# Patient Record
Sex: Female | Born: 1950 | Race: White | Hispanic: No | Smoking: Never smoker
Health system: Southern US, Community
[De-identification: ages and names within clinical notes are randomized; demographics above are authoritative.]

## PROBLEM LIST (undated history)

## (undated) DIAGNOSIS — E119 Type 2 diabetes mellitus without complications: Secondary | ICD-10-CM

## (undated) DIAGNOSIS — E039 Hypothyroidism, unspecified: Secondary | ICD-10-CM

## (undated) DIAGNOSIS — K219 Gastro-esophageal reflux disease without esophagitis: Secondary | ICD-10-CM

## (undated) DIAGNOSIS — N200 Calculus of kidney: Secondary | ICD-10-CM

## (undated) DIAGNOSIS — E785 Hyperlipidemia, unspecified: Secondary | ICD-10-CM

## (undated) HISTORY — PX: RENAL EXPLORATION: SHX2323

## (undated) HISTORY — PX: KNEE ARTHROSCOPY: SUR90

## (undated) HISTORY — DX: Hypothyroidism, unspecified: E03.9

## (undated) HISTORY — DX: Calculus of kidney: N20.0

## (undated) HISTORY — PX: OTHER SURGICAL HISTORY: SHX169

## (undated) HISTORY — PX: LAPAROSCOPIC GASTRIC BYPASS: SUR771

## (undated) HISTORY — DX: Gastro-esophageal reflux disease without esophagitis: K21.9

## (undated) HISTORY — DX: Hyperlipidemia, unspecified: E78.5

---

## 2020-02-10 ENCOUNTER — Other Ambulatory Visit: Payer: Self-pay | Admitting: Internal Medicine

## 2020-02-10 DIAGNOSIS — Z1231 Encounter for screening mammogram for malignant neoplasm of breast: Secondary | ICD-10-CM

## 2020-02-10 DIAGNOSIS — R200 Anesthesia of skin: Secondary | ICD-10-CM

## 2020-02-16 ENCOUNTER — Ambulatory Visit
Admission: RE | Admit: 2020-02-16 | Discharge: 2020-02-16 | Disposition: A | Discharge status: Home / Self Care | Payer: Medicare Other | Source: Ambulatory Visit | Attending: Internal Medicine | Admitting: Internal Medicine

## 2020-02-16 ENCOUNTER — Other Ambulatory Visit: Payer: Self-pay

## 2020-02-16 DIAGNOSIS — R200 Anesthesia of skin: Secondary | ICD-10-CM | POA: Diagnosis present

## 2020-04-08 ENCOUNTER — Encounter

## 2020-04-09 ENCOUNTER — Ambulatory Visit
Admission: RE | Admit: 2020-04-09 | Discharge: 2020-04-09 | Disposition: A | Discharge status: Home / Self Care | Payer: Medicare Other | Source: Ambulatory Visit | Attending: Internal Medicine | Admitting: Internal Medicine

## 2020-04-09 DIAGNOSIS — Z1231 Encounter for screening mammogram for malignant neoplasm of breast: Secondary | ICD-10-CM | POA: Diagnosis not present

## 2020-05-16 ENCOUNTER — Emergency Department
Admission: EM | Admit: 2020-05-16 | Discharge: 2020-05-16 | Disposition: A | Discharge status: Home / Self Care | Payer: Medicare Other | Attending: Emergency Medicine | Admitting: Emergency Medicine

## 2020-05-16 ENCOUNTER — Encounter: Payer: Self-pay | Admitting: Internal Medicine

## 2020-05-16 ENCOUNTER — Other Ambulatory Visit: Payer: Self-pay

## 2020-05-16 DIAGNOSIS — B86 Scabies: Secondary | ICD-10-CM

## 2020-05-16 DIAGNOSIS — R21 Rash and other nonspecific skin eruption: Secondary | ICD-10-CM | POA: Diagnosis present

## 2020-05-16 DIAGNOSIS — L299 Pruritus, unspecified: Secondary | ICD-10-CM | POA: Insufficient documentation

## 2020-05-16 MED ORDER — PERMETHRIN 5 % EX CREA: 1.0000 "application " | TOPICAL_CREAM | Freq: Once | CUTANEOUS | 0 refills | Status: AC

## 2020-05-16 MED ORDER — PREDNISONE 10 MG PO TABS: ORAL_TABLET | ORAL | 0 refills | Status: AC

## 2020-05-16 MED ORDER — PREDNISONE 20 MG PO TABS
60.0000 mg | ORAL_TABLET | Freq: Once | ORAL | Status: AC
  Administered 2020-05-16: 60 mg via ORAL
  Filled 2020-05-16: qty 3

## 2020-05-16 NOTE — ED Triage Notes (Signed)
Pt here for possible insect bites or hives, bug bites noted in triage, no hives noted, pt c/o itching

## 2020-05-16 NOTE — Discharge Instructions (Addendum)
You were seen today for a rash.  Your rash is most consistent with scabies.  I am giving you a prescription for steroids and permethrin cream.  Please use this as directed.  Please follow-up with your PCP if symptoms persist or worsen.

## 2020-05-16 NOTE — ED Provider Notes (Signed)
[facility] Emergency Department Provider Note ____________________________________________  Time seen: 1250  I have reviewed the triage vital signs and the nursing notes.  HISTORY  Chief Complaint  Urticaria and Insect Bite   HPI Jill Lopez is a 68 y.o. female presents to the ER today with complaint of rash.  She noticed this about 1 month ago.  She reports multiple insect bites with intense itchiness.  She has not noticed any drainage from these areas.  She reports she has cleaned her entire house, wash all of her bedding.  She has not noticed any bedbugs.  She does have a dog but has not noticed any fleas.  She denies changes in soaps, lotions or detergents.  She has tried Benadryl, hydrocortisone cream, alcohol and clear nail polish with minimal relief of symptoms.  She has not come in contact with anything that she is allergic to.  History reviewed. No pertinent past medical history.  There are no problems to display for this patient.   History reviewed. No pertinent surgical history.  Prior to Admission medications   Medication Sig Start Date End Date Taking? Authorizing Provider  permethrin (ELIMITE) 5 % cream Apply 1 application topically once for 1 dose. 05/16/20 05/16/20  Lorre Munroe, NP  predniSONE (DELTASONE) 10 MG tablet Take 6 tabs day 1, 5 tabs day 2, 4 tabs day 3, 3 tabs day 4, 2 tabs day 5, 1 tab day 6 05/16/20   Lorre Munroe, NP    Allergies Patient has no known allergies.  Family History  Problem Relation Age of Onset  . Breast cancer Mother     Social History Social History   Tobacco Use  . Smoking status: Not on file  Substance Use Topics  . Alcohol use: Not on file  . Drug use: Not on file    Review of Systems  Constitutional: Negative for fever, chills or body aches. Cardiovascular: Negative for chest pain or chest tightness. Respiratory: Negative for cough or shortness of breath. Skin: Positive for  rash. Neurological: Negative for headaches, focal weakness, tingling or numbness. ____________________________________________  PHYSICAL EXAM:  VITAL SIGNS: ED Triage Vitals  Enc Vitals Group     BP 05/16/20 1207 137/79     Pulse Rate 05/16/20 1207 60     Resp 05/16/20 1207 16     Temp 05/16/20 1207 98 F (36.7 C)     Temp Source 05/16/20 1207 Oral     SpO2 05/16/20 1207 98 %     Weight 05/16/20 1208 190 lb (86.2 kg)     Height 05/16/20 1208 5\' 7"  (1.702 m)     Head Circumference --      Peak Flow --      Pain Score 05/16/20 1208 0     Pain Loc --      Pain Edu? --      Excl. in GC? --     Constitutional: Alert and oriented. Well appearing and in no distress. Cardiovascular: Normal rate, regular rhythm.  Respiratory: Normal respiratory effort. No wheezes/rales/rhonchi. Neurologic:  Normal gait without ataxia. Normal speech and language. No gross focal neurologic deficits are appreciated. Skin: Grouped papular lesions on erythematous base noted in bilateral groins, bilateral buttocks and underneath bilateral breast.  She has similar scattered lesions noted on her upper chest, arms and lower legs. ____________________________________________  INITIAL IMPRESSION / ASSESSMENT AND PLAN / ED COURSE  Rash:  DDx include bed bug bites, flea bites, chiggers vs scabies Prednisone 60  mg PO x 1 RX for Permethrin cream- use as directed RX for Pred Taper x 6 days ____________________________________________  FINAL CLINICAL IMPRESSION(S) / ED DIAGNOSES  Final diagnoses:  Scabies  Pruritus      Lorre Munroe, NP 05/16/20 1300    Concha Se, MD 05/16/20 1315

## 2021-05-17 ENCOUNTER — Other Ambulatory Visit: Payer: Self-pay | Admitting: Family Medicine

## 2021-05-17 ENCOUNTER — Other Ambulatory Visit (HOSPITAL_COMMUNITY): Payer: Self-pay | Admitting: Family Medicine

## 2021-05-17 DIAGNOSIS — S32040A Wedge compression fracture of fourth lumbar vertebra, initial encounter for closed fracture: Secondary | ICD-10-CM

## 2021-05-17 DIAGNOSIS — Z1231 Encounter for screening mammogram for malignant neoplasm of breast: Secondary | ICD-10-CM

## 2021-05-21 ENCOUNTER — Ambulatory Visit
Admission: RE | Admit: 2021-05-21 | Discharge: 2021-05-21 | Disposition: A | Discharge status: Home / Self Care | Payer: Medicare Other | Source: Ambulatory Visit | Attending: Family Medicine | Admitting: Family Medicine

## 2021-05-21 DIAGNOSIS — S32040A Wedge compression fracture of fourth lumbar vertebra, initial encounter for closed fracture: Secondary | ICD-10-CM | POA: Diagnosis present

## 2022-05-03 IMAGING — MR MR LUMBAR SPINE W/O CM
5 series · 31 of 48 positions shown · non-contrast
Comparison: None.

CLINICAL DATA: Fall 1 week ago with low back pain and tailbone pain

EXAM:
MRI LUMBAR SPINE WITHOUT CONTRAST
TECHNIQUE: Multiplanar, multisequence MR imaging of the lumbar spine was
performed. No intravenous contrast was administered.

[Series 5: T2 · sagittal · 4.0mm · 0.81mm/px · 6 of 17 slices shown (1 of 2)]
[im 1/17]
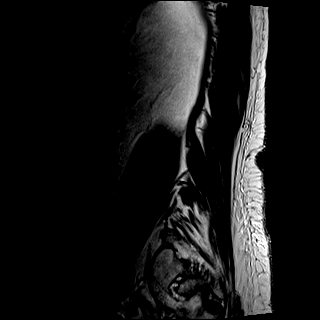
[im 4/17]
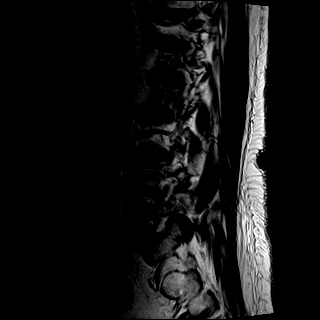
[im 7/17]
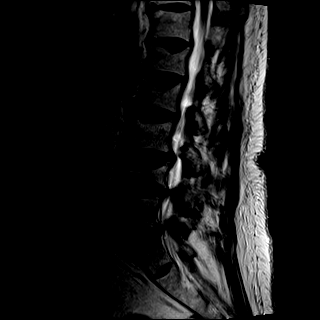
[im 10/17]
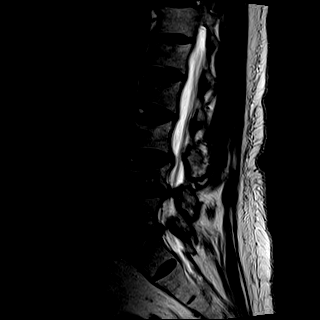
[im 13/17]
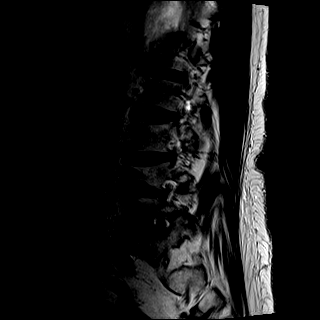
[im 17/17]
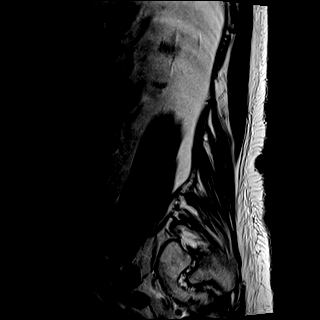

[Series 6: T1 · sagittal · 4.0mm · 0.81mm/px · 6 of 17 slices shown (1 of 2)]
[im 1/17]
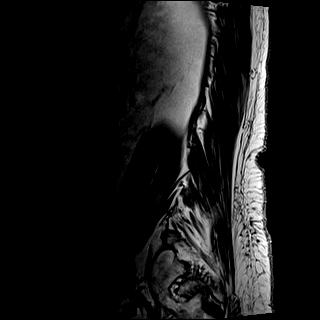
[im 4/17]
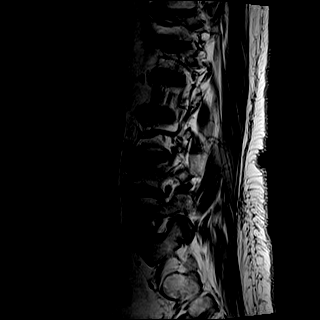
[im 7/17]
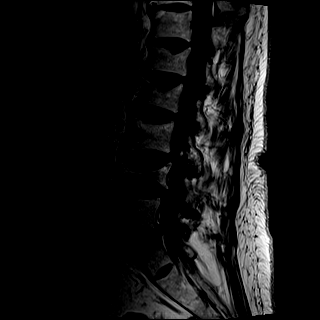
[im 10/17]
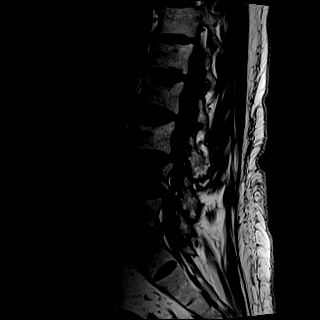
[im 13/17]
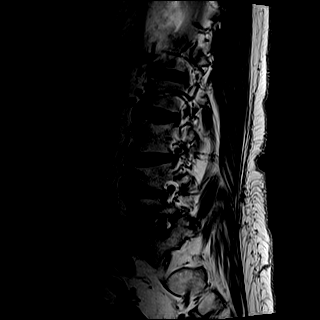
[im 17/17]
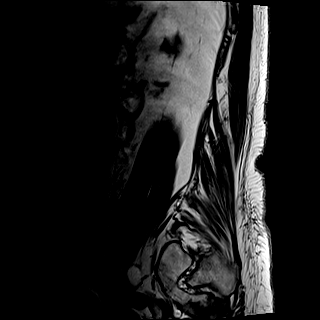

[Series 7: STIR · sagittal · 4.0mm · 0.41mm/px · 1 of 17 slices shown]
[im 1/17]
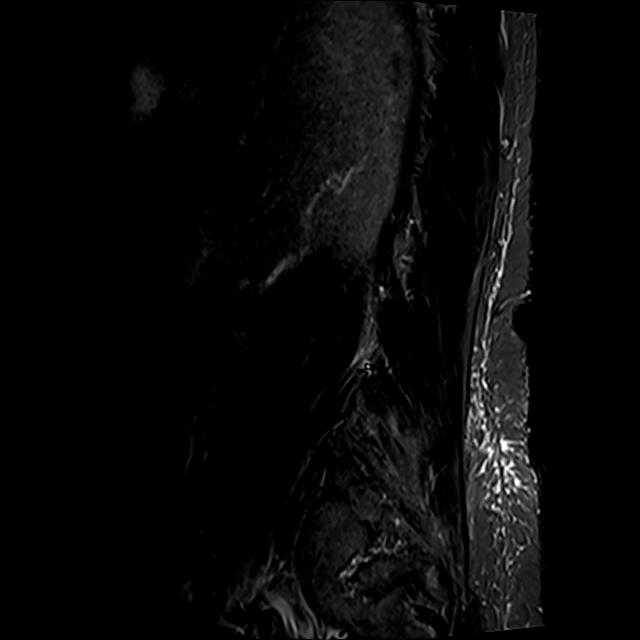

[Series 8: T2 · axial · 4.0mm · 0.78mm/px · z∈[-42,+152]mm · 9 of 40 slices shown (2 of 2)]
[im 1/40]
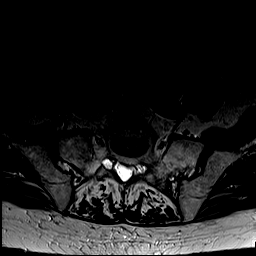
[im 6/40]
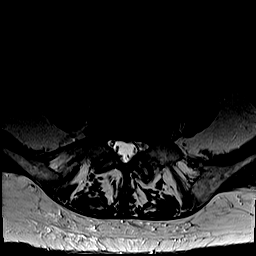
[im 12/40]
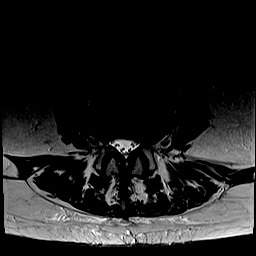
[im 17/40]
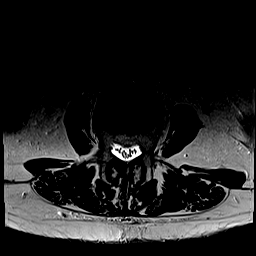
[im 20/40]
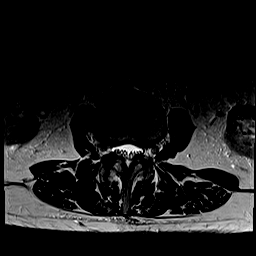
[im 23/40]
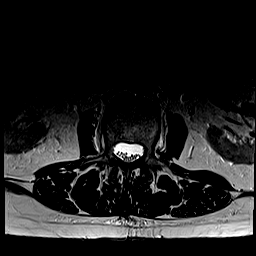
[im 28/40]
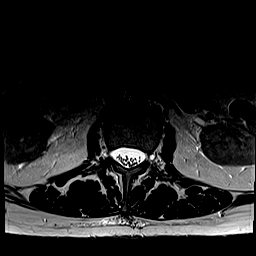
[im 34/40]
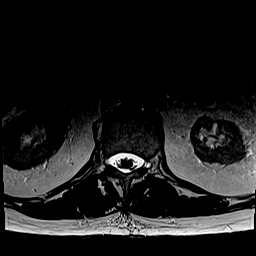
[im 40/40]
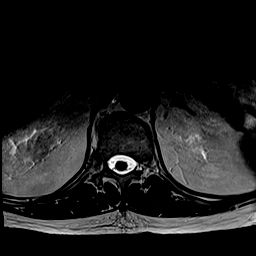

[Series 9: T1 · axial · 4.0mm · 0.39mm/px · z∈[-42,+152]mm · 9 of 40 slices shown (2 of 2)]
[im 1/40]
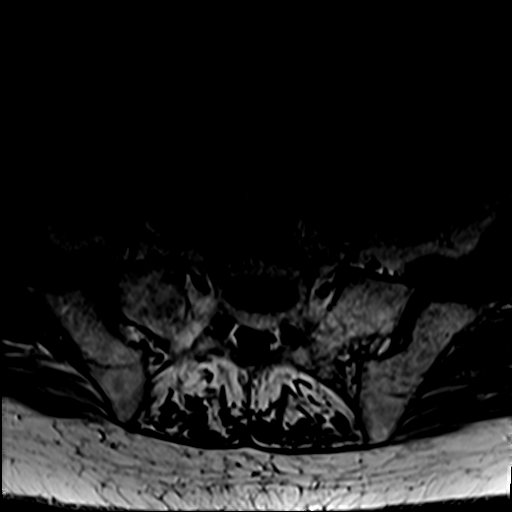
[im 6/40]
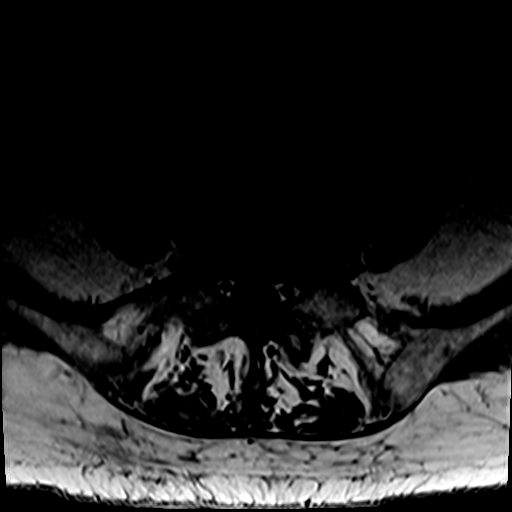
[im 12/40]
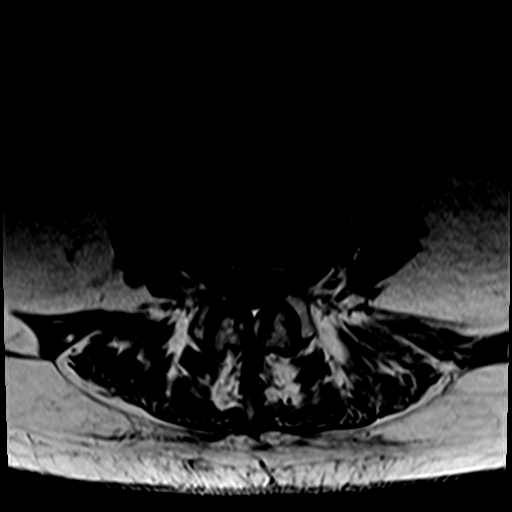
[im 17/40]
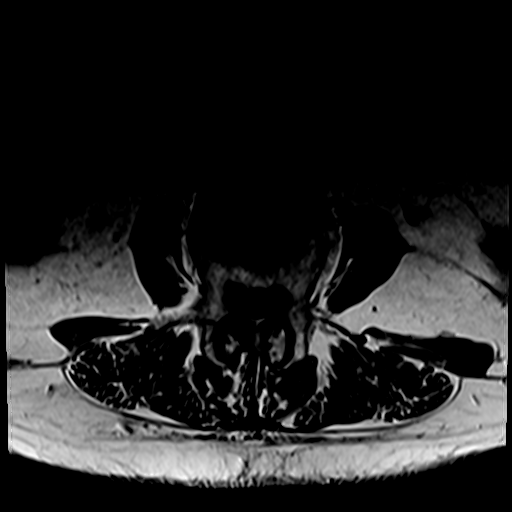
[im 20/40]
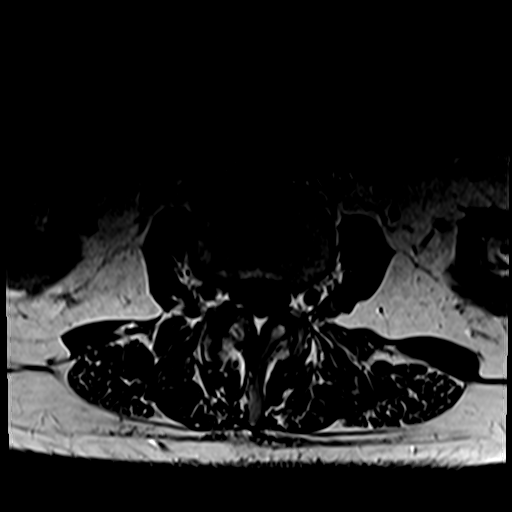
[im 23/40]
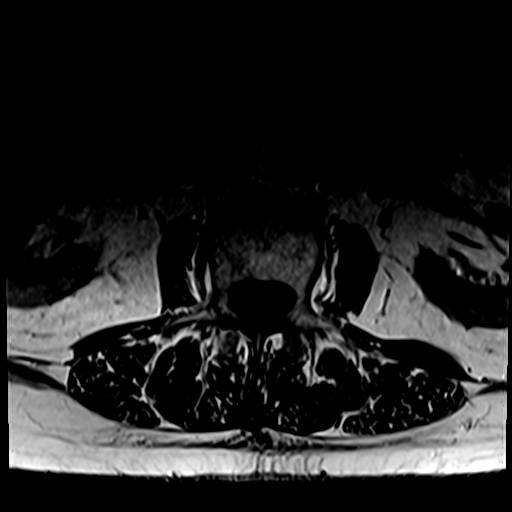
[im 28/40]
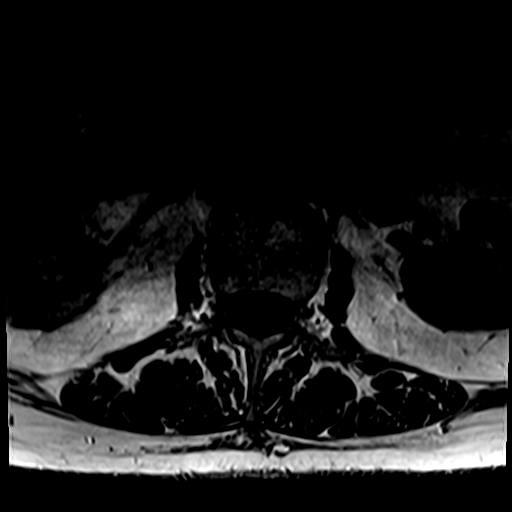
[im 34/40]
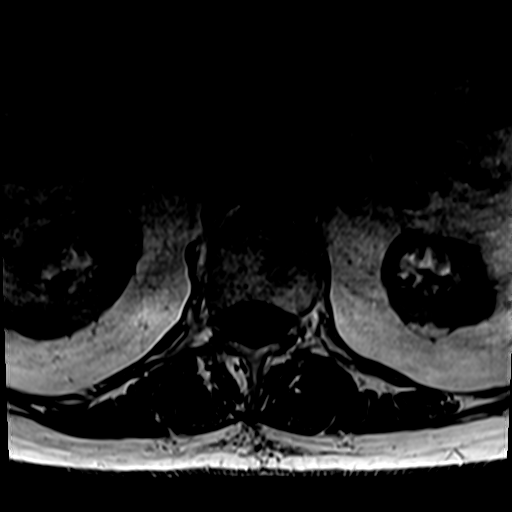
[im 40/40]
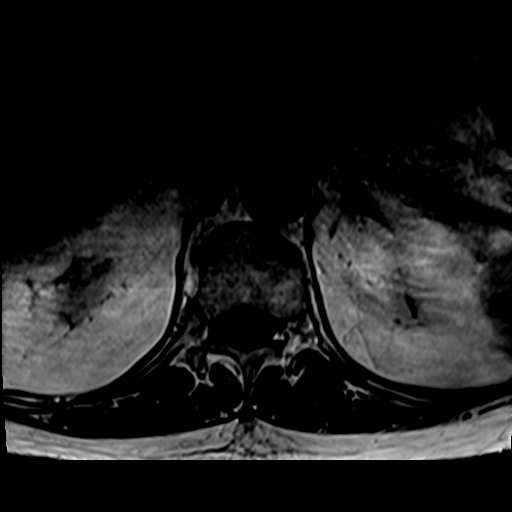

[31 of 48 positions shown; findings below may reference images not displayed]

FINDINGS: Segmentation:  Standard.

Alignment: Mild retrolisthesis at L4-5 and anterolisthesis at L3-4,
accentuated by posttraumatic deformity of the L4 vertebral body

Vertebrae: No acute fracture, evidence of discitis, or bone lesion.
Remote compression fractures of L1, L2, L3, and L4. At L4 there is
advanced central height loss and retropulsion. Mild edematous marrow
signal at the level of L4, but likely reactive to a Schmorl's node.
The fracture appears chronic and healed.

Conus medullaris and cauda equina: Conus extends to the L2 level.
Conus and cauda equina appear normal.

Paraspinal and other soft tissues: No perispinal mass or edema seen.
Scarring at the upper pole left kidney.

Disc levels:

T12- L1: Unremarkable.

L1-L2: Unremarkable.

L2-L3: Mild facet spurring and disc bulging

L3-L4: Disc bulging and mild facet spurring. Moderate narrowing of
the thecal sac without static compression

L4-L5: Disc bulging and mild facet spurring with ligamentum flavum
thickening. Accentuation by chronic L4 retropulsion. Moderate spinal
stenosis. There is potential L5 impingement at both subarticular
recesses

L5-S1:Disc narrowing and bulging eccentric to the left. Facet
osteoarthritis with bulky left-sided spurring. Left foraminal
impingement. Bilateral Subarticular recess narrowing that could
affect the S1 nerve roots.
IMPRESSION: 1. No acute finding such as fracture. Remote compression fractures
of L1-L4 with advanced height loss at L4.
2. Lumbar spine degeneration which is generalized and exacerbated at
L4 due to prior retropulsion.
3. L3-4 and L4-5 moderate spinal stenosis.
4. L4-5 and L5-S1 bilateral subarticular recess stenosis that could
affect the L5 and S1 nerve roots.
5. L5-S1 left foraminal impingement.

## 2022-05-17 ENCOUNTER — Other Ambulatory Visit: Payer: Self-pay | Admitting: Internal Medicine

## 2022-05-17 DIAGNOSIS — R413 Other amnesia: Secondary | ICD-10-CM

## 2022-06-12 ENCOUNTER — Ambulatory Visit (HOSPITAL_COMMUNITY)
Admission: RE | Admit: 2022-06-12 | Discharge: 2022-06-12 | Disposition: A | Discharge status: Home / Self Care | Payer: Medicare Other | Source: Ambulatory Visit | Attending: Internal Medicine | Admitting: Internal Medicine

## 2022-06-12 DIAGNOSIS — R413 Other amnesia: Secondary | ICD-10-CM | POA: Diagnosis not present

## 2022-06-13 ENCOUNTER — Other Ambulatory Visit: Payer: Self-pay | Admitting: Internal Medicine

## 2022-06-13 DIAGNOSIS — Z1231 Encounter for screening mammogram for malignant neoplasm of breast: Secondary | ICD-10-CM

## 2022-07-10 ENCOUNTER — Ambulatory Visit
Admission: RE | Admit: 2022-07-10 | Discharge: 2022-07-10 | Disposition: A | Discharge status: Home / Self Care | Payer: Medicare Other | Source: Ambulatory Visit | Attending: Internal Medicine | Admitting: Internal Medicine

## 2022-07-10 DIAGNOSIS — Z1231 Encounter for screening mammogram for malignant neoplasm of breast: Secondary | ICD-10-CM | POA: Diagnosis present

## 2022-09-16 ENCOUNTER — Emergency Department
Admission: EM | Admit: 2022-09-16 | Discharge: 2022-09-16 | Disposition: A | Discharge status: Home / Self Care | Payer: No Typology Code available for payment source | Attending: Emergency Medicine | Admitting: Emergency Medicine

## 2022-09-16 ENCOUNTER — Other Ambulatory Visit: Payer: Self-pay

## 2022-09-16 ENCOUNTER — Emergency Department: Payer: No Typology Code available for payment source

## 2022-09-16 DIAGNOSIS — Y92410 Unspecified street and highway as the place of occurrence of the external cause: Secondary | ICD-10-CM | POA: Diagnosis not present

## 2022-09-16 DIAGNOSIS — S62512A Displaced fracture of proximal phalanx of left thumb, initial encounter for closed fracture: Secondary | ICD-10-CM | POA: Insufficient documentation

## 2022-09-16 DIAGNOSIS — S6992XA Unspecified injury of left wrist, hand and finger(s), initial encounter: Secondary | ICD-10-CM | POA: Diagnosis present

## 2022-09-16 MED ORDER — HYDROCODONE-ACETAMINOPHEN 5-325 MG PO TABS: 1.0000 | ORAL_TABLET | Freq: Four times a day (QID) | ORAL | 0 refills | Status: DC | PRN

## 2022-09-16 MED ORDER — HYDROCODONE-ACETAMINOPHEN 5-325 MG PO TABS
1.0000 | ORAL_TABLET | ORAL | Status: AC
  Administered 2022-09-16: 1 via ORAL
  Filled 2022-09-16: qty 1
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
## 2022-09-16 NOTE — Discharge Instructions (Addendum)
Please keep splint clean and dry at all times.  You may use sling as needed for comfort.  Take Tylenol for mild to moderate pain and for severe pain use Norco.  Call orthopedic office Monday morning to schedule follow-up appointment.  Return to the ER for any worsening symptoms or any urgent changes in health
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
## 2023-01-26 ENCOUNTER — Ambulatory Visit: Payer: Medicare Other | Admitting: Urology

## 2023-02-08 ENCOUNTER — Ambulatory Visit: Payer: Medicare Other | Admitting: Urology

## 2023-02-19 ENCOUNTER — Encounter: Payer: Self-pay | Admitting: Emergency Medicine

## 2023-02-19 ENCOUNTER — Emergency Department: Attending: Cardiovascular Disease

## 2023-02-19 ENCOUNTER — Other Ambulatory Visit: Payer: Self-pay

## 2023-02-19 ENCOUNTER — Emergency Department
Admission: EM | Admit: 2023-02-19 | Discharge: 2023-02-19 | Disposition: A | Discharge status: Home / Self Care | Payer: Medicare Other | Attending: Emergency Medicine | Admitting: Emergency Medicine

## 2023-02-19 ENCOUNTER — Other Ambulatory Visit: Payer: Self-pay | Admitting: *Deleted

## 2023-02-19 DIAGNOSIS — R001 Bradycardia, unspecified: Secondary | ICD-10-CM

## 2023-02-19 DIAGNOSIS — E119 Type 2 diabetes mellitus without complications: Secondary | ICD-10-CM | POA: Insufficient documentation

## 2023-02-19 HISTORY — DX: Type 2 diabetes mellitus without complications: E11.9

## 2023-02-19 LAB — COMPREHENSIVE METABOLIC PANEL
ALT: 13 U/L (ref 0–44)
AST: 19 U/L (ref 15–41)
Albumin: 3.6 g/dL (ref 3.5–5.0)
Alkaline Phosphatase: 55 U/L (ref 38–126)
Anion gap: 9 (ref 5–15)
BUN: 11 mg/dL (ref 8–23)
CO2: 22 mmol/L (ref 22–32)
Calcium: 8.9 mg/dL (ref 8.9–10.3)
Chloride: 107 mmol/L (ref 98–111)
Creatinine, Ser: 0.68 mg/dL (ref 0.44–1.00)
GFR, Estimated: >60 mL/min (ref >60)
Glucose, Bld: 129 mg/dL — ABNORMAL HIGH (ref 70–99)
Potassium: 4.1 mmol/L (ref 3.5–5.1)
Sodium: 138 mmol/L (ref 135–145)
Total Bilirubin: 0.8 mg/dL (ref 0.3–1.2)
Total Protein: 7.1 g/dL (ref 6.5–8.1)

## 2023-02-19 LAB — CBC
HCT: 47.6 % — ABNORMAL HIGH (ref 36.0–46.0)
Hemoglobin: 15 g/dL (ref 12.0–15.0)
MCH: 30.5 pg (ref 26.0–34.0)
MCHC: 31.5 g/dL (ref 30.0–36.0)
MCV: 96.7 fL (ref 80.0–100.0)
Platelets: 209 10*3/uL (ref 150–400)
RBC: 4.92 MIL/uL (ref 3.87–5.11)
RDW: 13.1 % (ref 11.5–15.5)
WBC: 4.8 10*3/uL (ref 4.0–10.5)
nRBC: 0 % (ref 0.0–0.2)

## 2023-02-19 LAB — MAGNESIUM: Magnesium: 2.1 mg/dL (ref 1.7–2.4)

## 2023-02-19 LAB — TROPONIN I (HIGH SENSITIVITY): Troponin I (High Sensitivity): 6 ng/L (ref <18)

## 2023-02-19 NOTE — ED Provider Notes (Signed)
[facility] Provider Note    Event Date/Time   First MD Initiated Contact with Patient 02/19/23 [phone]     (approximate)   History   Low Heart Rate   HPI  Jill Lopez is a 72 y.o. female past medical history significant for cervical radiculopathy, diabetes, chronic pain, presents to the emergency department with low heart rate.  Patient states that she went to a follow-up appointment today at [facility] clinic and was sent to the emergency department for low heart rate.  According to nursing staff stated that patient had a heart rate in the 30s that was prolonged, was having some difficulty with gait and some lightheadedness during that time, walked over from [facility].  No complaints at this time.  Endorses weight gain over the past couple months.  Denies any new medications.  No recent falls or head trauma.  Denies any chest pain or shortness of breath.  No nausea, vomiting or diarrhea.     Physical Exam   Triage Vital Signs: ED Triage Vitals  Enc Vitals Group     BP 02/19/23 0920 (!) 142/81     Pulse Rate 02/19/23 0920 (!) 54     Resp 02/19/23 0920 18     Temp 02/19/23 0920 97.6 F (36.4 C)     Temp Source 02/19/23 0920 Oral     SpO2 02/19/23 0920 100 %     Weight --      Height --      Head Circumference --      Peak Flow --      Pain Score 02/19/23 0918 0     Pain Loc --      Pain Edu? --      Excl. in GC? --     Most recent vital signs: Vitals:   02/19/23 0920 02/19/23 1030  BP: (!) 142/81 129/75  Pulse: (!) 54 (!) 52  Resp: 18 15  Temp: 97.6 F (36.4 C)   SpO2: 100% 97%    Physical Exam Constitutional:      Appearance: She is well-developed.  HENT:     Head: Atraumatic.  Eyes:     Conjunctiva/sclera: Conjunctivae normal.  Cardiovascular:     Rate and Rhythm: Regular rhythm. Bradycardia present.     Comments: +2 radial and DP/PT pulses that are equal bilaterally Pulmonary:     Effort: No respiratory distress.   Abdominal:     General: There is no distension.  Musculoskeletal:        General: Normal range of motion.     Cervical back: Normal range of motion.     Right lower leg: No edema.     Left lower leg: No edema.  Skin:    General: Skin is warm.     Capillary Refill: Capillary refill takes less than 2 seconds.  Neurological:     Mental Status: She is alert. Mental status is at baseline.     IMPRESSION / MDM / ASSESSMENT AND PLAN / ED COURSE  I reviewed the triage vital signs and the nursing notes.  Differential diagnosis including symptomatic bradycardia, heart block, electrolyte abnormality, hypothyroidism, ACS, dysrhythmia  EKG  I, Corena Herter, the attending physician, personally viewed and interpreted this ECG.   Rate: 54  Rhythm: Normal sinus  Axis: Normal  Intervals: QTc prolonged at 572.  ST&T Change: Flattening of T waves to the inferior and lateral leads.  No significant ST elevation. No prior EKG to compare  Sinus  bradycardia with frequent PVCs while on cardiac telemetry.    LABS (all labs ordered are listed, but only abnormal results are displayed) Labs interpreted as -    Labs Reviewed  CBC - Abnormal; Notable for the following components:      Result Value   HCT 47.6 (*)    All other components within normal limits  COMPREHENSIVE METABOLIC PANEL - Abnormal; Notable for the following components:   Glucose, Bld 129 (*)    All other components within normal limits  MAGNESIUM  TSH  TROPONIN I (HIGH SENSITIVITY)  TROPONIN I (HIGH SENSITIVITY)     MDM    No significant electrolyte abnormalities.  Thyroid studies within normal limits.  Troponin negative have low suspicion for ACS.  Discussed the patient's case with cardiology Dr. Malaga Callas, most likely had bradycardia with frequent PVCs given that she has had PVCs while on cardiac telemetry in the emergency department.  Recommended outpatient follow-up with cardiology with likely cardiac telemetry  monitoring.  Given return precautions.   PROCEDURES:  Critical Care performed: No  Procedures  Patient's presentation is most consistent with acute presentation with potential threat to life or bodily function.   MEDICATIONS ORDERED IN ED: Medications - No data to display  FINAL CLINICAL IMPRESSION(S) / ED DIAGNOSES   Final diagnoses:  Bradycardia     Rx / DC Orders   ED Discharge Orders          Ordered    Ambulatory referral to Cardiology       Comments: If you have not heard from the Cardiology office within the next 72 hours please call [phone].   02/19/23 1236             Note:  This document was prepared using Dragon voice recognition software and may include unintentional dictation errors.   Corena Herter, MD 02/19/23 [phone]

## 2023-02-19 NOTE — ED Triage Notes (Signed)
Patient sent to ED from [facility] for low heart rate- 30's. Initially being seen for orthopedic issues when vital noted to be abnormal. Patient denies cardiac history or pain at this time.

## 2023-02-19 NOTE — Discharge Instructions (Addendum)
You are seen in the emergency department for low heart rate.  Your heart rate was in the 50s in the emergency department.  I discussed with cardiology who is going to follow you up in outpatient.  Concern that you are having frequent PVCs in office which was causing your heart rate to read abnormally low.  Return to the emergency department if you have any episodes of feeling lightheaded, passing out or weak.  Stay hydrated and drink plenty of fluids.

## 2023-02-20 ENCOUNTER — Other Ambulatory Visit: Payer: Self-pay | Admitting: Family Medicine

## 2023-02-20 DIAGNOSIS — M5412 Radiculopathy, cervical region: Secondary | ICD-10-CM

## 2023-02-22 DIAGNOSIS — R001 Bradycardia, unspecified: Secondary | ICD-10-CM

## 2023-03-03 NOTE — Progress Notes (Signed)
H&P  Chief Complaint: Frequent urination, incontinence  History of Present Illness: Jill Lopez is a 72 y.o. year old female referred by Dr. Nemiah Commander for evaluation and management of urinary frequency, urgency and urgency incontinence.  This has been a issue for several months.  She has a good stream and usually feels like she empties well.  However, because of the amount of leakage she has she usually goes through at least 1-2 pads a day.  She denies gross hematuria, dysuria.  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  She also has a history of urolithiasis, last treated about 10 years ago in [place].  He does drink a fair amount of tea and also 1 cup of coffee a day.  No sodas.  Past Medical History:  Diagnosis Date   Diabetes mellitus without complication (HCC)     No past surgical history on file.  Home Medications:  (Not in a hospital admission)   Allergies: No Known Allergies  Family History  Problem Relation Age of Onset   Breast cancer Mother     Social History:  has no history on file for tobacco use, alcohol use, and drug use.  ROS: A complete review of systems was performed.  All systems are negative except for pertinent findings as noted.  Physical Exam:  Vital signs in last 24 hours: @VSRANGES @ General:  Alert and oriented, No acute distress HEENT: Normocephalic, atraumatic Neck: No JVD or lymphadenopathy Cardiovascular: Regular rate  Lungs: Normal inspiratory/expiratory excursion Neurologic: Grossly intact  I have reviewed prior pt notes  I have reviewed notes from referring/previous physicians  I have reviewed urinalysis results  I have independently reviewed prior imaging-bladder scan  I have reviewed prior laboratories-CHEM profile and CBC from 2 weeks ago.  Normal renal function.  Impression/Assessment:  Overactive bladder-wet.  Empties well.  History of urolithiasis  History  of duplication of upper tracts  Plan:  1.  Overactive bladder guide sheet given  2.  Limit caffeine in diet  3.  She takes her furosemide at about 7 PM-I recommended she take it either early in the morning or earlier in the afternoon  4.  Consider eventual KUB to follow-up history of urolithiasis  5.  I will see back in about 3 months to recheck symptoms  Chelsea Aus 03/03/2023, 6:35 PM  Bertram Millard. Lashawn Bromwell MD

## 2023-03-06 ENCOUNTER — Ambulatory Visit (INDEPENDENT_AMBULATORY_CARE_PROVIDER_SITE_OTHER): Payer: Medicare Other | Admitting: Urology

## 2023-03-06 ENCOUNTER — Encounter: Payer: Self-pay | Admitting: Urology

## 2023-03-06 VITALS — BP 91/61 | HR 85 | Ht 66.0 in | Wt 210.0 lb

## 2023-03-06 DIAGNOSIS — R350 Frequency of micturition: Secondary | ICD-10-CM | POA: Diagnosis not present

## 2023-03-06 DIAGNOSIS — N3281 Overactive bladder: Secondary | ICD-10-CM | POA: Diagnosis not present

## 2023-03-06 DIAGNOSIS — Z87442 Personal history of urinary calculi: Secondary | ICD-10-CM

## 2023-03-06 DIAGNOSIS — M5412 Radiculopathy, cervical region: Secondary | ICD-10-CM | POA: Diagnosis not present

## 2023-03-06 DIAGNOSIS — N200 Calculus of kidney: Secondary | ICD-10-CM

## 2023-03-06 LAB — BLADDER SCAN AMB NON-IMAGING: Scan Result: 0

## 2023-03-06 NOTE — Progress Notes (Signed)
post void residual =0mL 

## 2023-03-16 ENCOUNTER — Telehealth: Payer: Self-pay | Admitting: *Deleted

## 2023-03-16 NOTE — Telephone Encounter (Signed)
Call placed to the patient to follow up on the cardiac monitor that was ordered for her from her ED visit. She stated that she has now placed it on and is due to mail it back 5/23.  The patient stated that she has been feeling good without any problems. She has confirmed her appointment on 04/06/23.

## 2023-04-06 ENCOUNTER — Ambulatory Visit: Admitting: Cardiology

## 2023-04-13 ENCOUNTER — Other Ambulatory Visit: Payer: Self-pay | Admitting: Emergency Medicine

## 2023-04-13 ENCOUNTER — Other Ambulatory Visit: Payer: Self-pay

## 2023-04-13 ENCOUNTER — Telehealth: Payer: Self-pay | Admitting: Cardiology

## 2023-04-13 DIAGNOSIS — I480 Paroxysmal atrial fibrillation: Secondary | ICD-10-CM

## 2023-04-13 NOTE — Telephone Encounter (Signed)
Dr. Nemiah Commander called in asking to speak to Dr. Azucena Cecil about this pt. Informed her I wasn't sure if he can consult on her since she is not a pt yet but she stated one of our providers saw her in the ED and ordered the heart monitor. Please advise.    Best c/b - [phone]

## 2023-04-17 NOTE — Telephone Encounter (Signed)
LMOV  

## 2023-04-23 NOTE — Telephone Encounter (Signed)
LMOV to schedule  

## 2023-05-02 ENCOUNTER — Ambulatory Visit: Payer: Medicare Other | Attending: Cardiology

## 2023-05-02 DIAGNOSIS — I480 Paroxysmal atrial fibrillation: Secondary | ICD-10-CM

## 2023-05-03 LAB — ECHOCARDIOGRAM COMPLETE
AR max vel: 3.85 cm2
AV Area VTI: 4.2 cm2
AV Area mean vel: 3.81 cm2
AV Mean grad: 2 mmHg
AV Peak grad: 3.5 mmHg
Ao pk vel: 0.93 m/s
Area-P 1/2: 2.64 cm2
Calc EF: 51.6 %
S' Lateral: 3.9 cm
Single Plane A2C EF: 49.6 %
Single Plane A4C EF: 49.2 %

## 2023-05-04 ENCOUNTER — Ambulatory Visit: Payer: Medicare Other | Attending: Cardiology | Admitting: Cardiology

## 2023-05-04 ENCOUNTER — Encounter: Payer: Self-pay | Admitting: Cardiology

## 2023-05-04 VITALS — BP 98/60 | HR 69 | Ht 67.0 in | Wt 208.8 lb

## 2023-05-04 DIAGNOSIS — I340 Nonrheumatic mitral (valve) insufficiency: Secondary | ICD-10-CM | POA: Insufficient documentation

## 2023-05-04 DIAGNOSIS — E7800 Pure hypercholesterolemia, unspecified: Secondary | ICD-10-CM | POA: Insufficient documentation

## 2023-05-04 DIAGNOSIS — I77810 Thoracic aortic ectasia: Secondary | ICD-10-CM | POA: Diagnosis not present

## 2023-05-04 DIAGNOSIS — I471 Supraventricular tachycardia, unspecified: Secondary | ICD-10-CM | POA: Insufficient documentation

## 2023-05-04 NOTE — Patient Instructions (Addendum)
Medication Instructions:  Your physician recommends that you continue on your current medications as directed. Please refer to the Current Medication list given to you today.  *If you need a refill on your cardiac medications before your next appointment, please call your pharmacy*  Lab Work: -None ordered  Testing/Procedures: Your physician has requested that you have an echocardiogram. Echocardiography is a painless test that uses sound waves to create images of your heart. It provides your doctor with information about the size and shape of your heart and how well your heart's chambers and valves are working. This procedure takes approximately one hour. There are no restrictions for this procedure. Please do NOT wear cologne, perfume, aftershave, or lotions (deodorant is allowed). Please arrive 15 minutes prior to your appointment time.   Follow-Up: At [facility], you and your health needs are our priority.  As part of our continuing mission to provide you with exceptional heart care, we have created designated Provider Care Teams.  These Care Teams include your primary Cardiologist (physician) and Advanced Practice Providers (APPs -  Physician Assistants and Nurse Practitioners) who all work together to provide you with the care you need, when you need it.  Your next appointment:   1 year(s)  Provider:   You may see Debbe Odea, MD or one of the following Advanced Practice Providers on your designated Care Team:   Nicolasa Ducking, NP Eula Listen, PA-C Cadence Fransico Michael, PA-C Charlsie Quest, NP    Other Instructions -Echo to be scheduled in 1 year

## 2023-05-04 NOTE — Progress Notes (Signed)
Cardiology Office Note:    Date:  05/04/2023   ID:  Jill Lopez, DOB 04/23/1951, MRN [id number]  PCP:  Enid Baas, MD   [facility] Providers Cardiologist:  Debbe Odea, MD     Referring MD: Enid Baas, MD   Chief Complaint  Patient presents with   New Patient (Initial Visit)    Patient here for cardiac evaluation following recent ED visit for bradycardia.  Recent echo and Zio results available in chart.  Still has dyspnea on exertion.      History of Present Illness:    Jill Lopez is a 72 y.o. female with a hx of diabetes, hyperlipidemia OSA who presents due to low heart rates.  Saw PCP for scheduled visit about 2 months ago, heart rates were noted to be low in the 30s.  Patient was advised to go to the emergency room.  In the ED, EKG showed sinus bradycardia with heart rate 54.  Cardiac monitor was placed to evaluate any high degree AV block.  Monitor 4/24 did not show occasional paroxysmal SVTs, occasional PVCs 3-5% burden.  Average heart rate 69-72.  Echocardiogram 02/2023 EF 55 to 60%, mild mitral regurgitation, mild aortic root dilatation 42 mm.  She denies any symptoms of palpitations.  Started on semaglutide for both diabetes and weight loss.  Has lost about 10 pounds since starting.  Also changed her eating habits.  Diagnosed with sleep apnea, waiting on CPAP machine.  Past Medical History:  Diagnosis Date   Diabetes mellitus without complication (HCC)    GERD (gastroesophageal reflux disease)    Hyperlipidemia    Hypothyroidism    Kidney stones     Past Surgical History:  Procedure Laterality Date   INJECTION THERAPEUTIC AGENT CARPAL TUNNEL      KNEE ARTHROSCOPY     LAPAROSCOPIC GASTRIC BYPASS     RENAL EXPLORATION      Current Medications: Current Meds  Medication Sig   alendronate (FOSAMAX) 70 MG tablet Take 70 mg by mouth.   cyanocobalamin (VITAMIN B12) 1000 MCG/ML injection Inject 1,000 mcg into the  muscle every 30 (thirty) days.   furosemide (LASIX) 20 MG tablet Take 1 tablet by mouth every morning.   gabapentin (NEURONTIN) 100 MG capsule Take 100 mg by mouth at bedtime.   HYDROcodone-acetaminophen (NORCO) 5-325 MG tablet Take 1 tablet by mouth every 6 (six) hours as needed for moderate pain.   levothyroxine (SYNTHROID) 50 MCG tablet Take 50 mcg by mouth.   lisinopril (ZESTRIL) 2.5 MG tablet Take 2.5 mg by mouth daily.   metFORMIN (GLUCOPHAGE) 500 MG tablet Take 1 tablet by mouth 2 (two) times daily.   ondansetron (ZOFRAN-ODT) 8 MG disintegrating tablet Take 8 mg by mouth every 8 (eight) hours as needed.   pantoprazole (PROTONIX) 40 MG tablet Take 40 mg by mouth.   rosuvastatin (CRESTOR) 20 MG tablet Take 20 mg by mouth.   Semaglutide,0.25 or 0.5MG /DOS, 2 MG/3ML SOPN Inject 0.25 mg into the skin.   traMADol (ULTRAM) 50 MG tablet Take 50 mg by mouth every 6 (six) hours as needed.     Allergies:   Other   Social History   Socioeconomic History   Marital status: Widowed    Spouse name: Not on file   Number of children: Not on file   Years of education: Not on file   Highest education level: Not on file  Occupational History   Not on file  Tobacco Use   Smoking status: Never  Smokeless tobacco: Never  Vaping Use   Vaping Use: Never used  Substance and Sexual Activity   Alcohol use: Never   Drug use: Never   Sexual activity: Not on file  Other Topics Concern   Not on file  Social History Narrative   Not on file   Social Determinants of Health   Financial Resource Strain: Not on file  Food Insecurity: Not on file  Transportation Needs: Not on file  Physical Activity: Not on file  Stress: Not on file  Social Connections: Not on file     Family History: The patient's family history includes Atrial fibrillation in her daughter; Breast cancer in her mother; Heart disease in her daughter.  ROS:   Please see the history of present illness.     All other systems  reviewed and are negative.  EKGs/Labs/Other Studies Reviewed:    The following studies were reviewed today:  EKG Interpretation Date/Time:  Friday May 04 2023 13:58:20 EDT Ventricular Rate:  69 PR Interval:  140 QRS Duration:  74 QT Interval:  382 QTC Calculation: 409 R Axis:   -26  Text Interpretation: Normal sinus rhythm Nonspecific T wave abnormality Confirmed by Debbe Odea ([id number]) on 05/04/2023 2:04:15 PM    Recent Labs: 02/19/2023: ALT 13; BUN 11; Creatinine, Ser 0.68; Hemoglobin 15.0; Magnesium 2.1; Platelets 209; Potassium 4.1; Sodium 138  Recent Lipid Panel No results found for: "CHOL", "TRIG", "HDL", "CHOLHDL", "VLDL", "LDLCALC", "LDLDIRECT"   Risk Assessment/Calculations:             Physical Exam:    VS:  BP 98/60 (BP Location: Left Arm, Patient Position: Sitting, Cuff Size: Large)   Pulse 69   Ht 5\' 7"  (1.702 m)   Wt 208 lb 12.8 oz (94.7 kg)   BMI 32.70 kg/m     Wt Readings from Last 3 Encounters:  05/04/23 208 lb 12.8 oz (94.7 kg)  03/06/23 210 lb (95.3 kg)  09/16/22 189 lb 9.5 oz (86 kg)     GEN:  Well nourished, well developed in no acute distress HEENT: Normal NECK: No JVD; No carotid bruits CARDIAC: RRR, no murmurs, rubs, gallops RESPIRATORY:  Clear to auscultation without rales, wheezing or rhonchi  ABDOMEN: Soft, non-tender, non-distended MUSCULOSKELETAL:  No edema; No deformity  SKIN: Warm and dry NEUROLOGIC:  Alert and oriented x 3 PSYCHIATRIC:  Normal affect   ASSESSMENT:    1. Paroxysmal SVT (supraventricular tachycardia)   2. Pure hypercholesterolemia   3. Aortic root dilatation (HCC)   4. Mitral valve insufficiency, unspecified etiology    PLAN:    In order of problems listed above:  Paroxysmal SVT, PVCs 1.7% burden . echo with normal EF 55 to 60%.  Average heart rate on cardiac monitor 68 to 72 bpm.  BP today 96/60, heart rate 69.  Patient asymptomatic, avoid AV nodal agents.  Untreated sleep apnea possibly contributing  to arrhythmias.  Weight loss and treatment of sleep apnea showed improve Hyperlipidemia, continue Crestor 20 mg daily. Mild aortic root dilatation, continue to monitor serially.  Repeat echo in 1 year.     Follow-up in 1 year.  Medication Adjustments/Labs and Tests Ordered: Current medicines are reviewed at length with the patient today.  Concerns regarding medicines are outlined above.  Orders Placed This Encounter  Procedures   EKG 12-Lead   ECHOCARDIOGRAM COMPLETE   No orders of the defined types were placed in this encounter.   Patient Instructions  Medication Instructions:  Your physician recommends that you  continue on your current medications as directed. Please refer to the Current Medication list given to you today.  *If you need a refill on your cardiac medications before your next appointment, please call your pharmacy*  Lab Work: -None ordered  Testing/Procedures: Your physician has requested that you have an echocardiogram. Echocardiography is a painless test that uses sound waves to create images of your heart. It provides your doctor with information about the size and shape of your heart and how well your heart's chambers and valves are working. This procedure takes approximately one hour. There are no restrictions for this procedure. Please do NOT wear cologne, perfume, aftershave, or lotions (deodorant is allowed). Please arrive 15 minutes prior to your appointment time.   Follow-Up: At [facility], you and your health needs are our priority.  As part of our continuing mission to provide you with exceptional heart care, we have created designated Provider Care Teams.  These Care Teams include your primary Cardiologist (physician) and Advanced Practice Providers (APPs -  Physician Assistants and Nurse Practitioners) who all work together to provide you with the care you need, when you need it.  Your next appointment:   1 year(s)  Provider:   You may see  Debbe Odea, MD or one of the following Advanced Practice Providers on your designated Care Team:   Nicolasa Ducking, NP Eula Listen, PA-C Cadence Fransico Michael, PA-C Charlsie Quest, NP    Other Instructions -Echo to be scheduled in 1 year   Signed, Debbe Odea, MD  05/04/2023 2:58 PM    [facility]

## 2023-06-11 NOTE — Progress Notes (Incomplete)
History of Present Illness: Catalyna Halleck is a 72 y.o. year old female returning for follow-up of overactive bladder symptoms associated with urgency incontinence.  Past Medical History:  Diagnosis Date   Diabetes mellitus without complication (HCC)    GERD (gastroesophageal reflux disease)    Hyperlipidemia    Hypothyroidism    Kidney stones     Past Surgical History:  Procedure Laterality Date   INJECTION THERAPEUTIC AGENT CARPAL TUNNEL      KNEE ARTHROSCOPY     LAPAROSCOPIC GASTRIC BYPASS     RENAL EXPLORATION      Home Medications:  (Not in a hospital admission)   Allergies:  Allergies  Allergen Reactions   Other Itching and Other (See Comments)    Propoxyphene N-Acetaminophen (Darvocet) caused "Tingling"    Family History  Problem Relation Age of Onset   Breast cancer Mother    Heart disease Daughter    Atrial fibrillation Daughter     Social History:  reports that she has never smoked. She has never used smokeless tobacco. She reports that she does not drink alcohol and does not use drugs.  ROS: A complete review of systems was performed.  All systems are negative except for pertinent findings as noted.  Physical Exam:  Vital signs in last 24 hours: @VSRANGES @ General:  Alert and oriented, No acute distress HEENT: Normocephalic, atraumatic Neck: No JVD or lymphadenopathy Cardiovascular: Regular rate  Lungs: Normal inspiratory/expiratory excursion Abdomen: Soft, nontender, nondistended, no abdominal masses Back: No CVA tenderness Extremities: No edema Neurologic: Grossly intact  I have reviewed prior pt notes  I have reviewed notes from referring/previous physicians  I have reviewed urinalysis results  I have independently reviewed prior imaging  I have reviewed prior urine culture   Impression/Assessment:  ***  Plan:  Bertram Millard Lonnell Chaput 06/11/2023, 1:06 PM  Bertram Millard. Norlan Rann MD

## 2023-06-12 ENCOUNTER — Ambulatory Visit: Admitting: Urology

## 2023-06-12 DIAGNOSIS — R350 Frequency of micturition: Secondary | ICD-10-CM

## 2023-06-12 DIAGNOSIS — N200 Calculus of kidney: Secondary | ICD-10-CM

## 2023-06-12 DIAGNOSIS — M5412 Radiculopathy, cervical region: Secondary | ICD-10-CM

## 2023-06-12 DIAGNOSIS — R32 Unspecified urinary incontinence: Secondary | ICD-10-CM

## 2023-06-18 ENCOUNTER — Ambulatory Visit: Admitting: Cardiology

## 2023-07-30 ENCOUNTER — Other Ambulatory Visit: Payer: Self-pay

## 2023-07-30 ENCOUNTER — Emergency Department: Payer: Medicare Other

## 2023-07-30 DIAGNOSIS — R059 Cough, unspecified: Secondary | ICD-10-CM | POA: Diagnosis present

## 2023-07-30 DIAGNOSIS — U071 COVID-19: Secondary | ICD-10-CM | POA: Insufficient documentation

## 2023-07-30 LAB — CBC WITH DIFFERENTIAL/PLATELET
Abs Immature Granulocytes: 0.01 10*3/uL (ref 0.00–0.07)
Basophils Absolute: 0 10*3/uL (ref 0.0–0.1)
Basophils Relative: 1 %
Eosinophils Absolute: 0.2 10*3/uL (ref 0.0–0.5)
Eosinophils Relative: 3 %
HCT: 42.2 % (ref 36.0–46.0)
Hemoglobin: 13.9 g/dL (ref 12.0–15.0)
Immature Granulocytes: 0 %
Lymphocytes Relative: 11 %
Lymphs Abs: 0.7 10*3/uL (ref 0.7–4.0)
MCH: 31.5 pg (ref 26.0–34.0)
MCHC: 32.9 g/dL (ref 30.0–36.0)
MCV: 95.7 fL (ref 80.0–100.0)
Monocytes Absolute: 0.6 10*3/uL (ref 0.1–1.0)
Monocytes Relative: 9 %
Neutro Abs: 5.1 10*3/uL (ref 1.7–7.7)
Neutrophils Relative %: 76 %
Platelets: 181 10*3/uL (ref 150–400)
RBC: 4.41 MIL/uL (ref 3.87–5.11)
RDW: 13 % (ref 11.5–15.5)
WBC: 6.6 10*3/uL (ref 4.0–10.5)
nRBC: 0 % (ref 0.0–0.2)

## 2023-07-30 LAB — COMPREHENSIVE METABOLIC PANEL
ALT: 16 U/L (ref 0–44)
AST: 20 U/L (ref 15–41)
Albumin: 3.7 g/dL (ref 3.5–5.0)
Alkaline Phosphatase: 66 U/L (ref 38–126)
Anion gap: 11 (ref 5–15)
BUN: 13 mg/dL (ref 8–23)
CO2: 23 mmol/L (ref 22–32)
Calcium: 8.7 mg/dL — ABNORMAL LOW (ref 8.9–10.3)
Chloride: 103 mmol/L (ref 98–111)
Creatinine, Ser: 0.73 mg/dL (ref 0.44–1.00)
GFR, Estimated: >60 mL/min (ref >60)
Glucose, Bld: 224 mg/dL — ABNORMAL HIGH (ref 70–99)
Potassium: 3.6 mmol/L (ref 3.5–5.1)
Sodium: 137 mmol/L (ref 135–145)
Total Bilirubin: 0.9 mg/dL (ref 0.3–1.2)
Total Protein: 7.1 g/dL (ref 6.5–8.1)

## 2023-07-30 LAB — SARS CORONAVIRUS 2 BY RT PCR: SARS Coronavirus 2 by RT PCR: POSITIVE — AB

## 2023-07-30 MED ORDER — ACETAMINOPHEN 500 MG PO TABS
1000.0000 mg | ORAL_TABLET | Freq: Once | ORAL | Status: AC
  Administered 2023-07-30: 1000 mg via ORAL
  Filled 2023-07-30: qty 2

## 2023-07-30 NOTE — ED Notes (Signed)
No answer when called several times from lobby 

## 2023-07-30 NOTE — ED Triage Notes (Signed)
Pt presents to ER with c/o cough that started this morning.  Pt states she woke up this morning coughing, with some post-nasal drip, pain in her head and neck.  No difficulty breathing per pt.  Pt denies any recent known sick contacts.  Pt is otherwise A&O x4 and in NAD at this time.

## 2023-07-31 ENCOUNTER — Emergency Department
Admission: EM | Admit: 2023-07-31 | Discharge: 2023-07-31 | Disposition: A | Discharge status: Home / Self Care | Payer: Medicare Other | Attending: Emergency Medicine | Admitting: Emergency Medicine

## 2023-07-31 DIAGNOSIS — U071 COVID-19: Secondary | ICD-10-CM | POA: Diagnosis not present

## 2023-07-31 DIAGNOSIS — R051 Acute cough: Secondary | ICD-10-CM

## 2023-07-31 MED ORDER — BENZONATATE 100 MG PO CAPS
100.0000 mg | ORAL_CAPSULE | Freq: Once | ORAL | Status: AC
  Administered 2023-07-31: 100 mg via ORAL
  Filled 2023-07-31: qty 1

## 2023-07-31 MED ORDER — BENZONATATE 100 MG PO CAPS: 100.0000 mg | ORAL_CAPSULE | Freq: Three times a day (TID) | ORAL | 0 refills | Status: AC | PRN

## 2023-07-31 MED ORDER — NIRMATRELVIR/RITONAVIR (PAXLOVID)TABLET: 3.0000 | ORAL_TABLET | Freq: Two times a day (BID) | ORAL | 0 refills | Status: DC

## 2023-07-31 MED ORDER — NIRMATRELVIR/RITONAVIR (PAXLOVID)TABLET: 3.0000 | ORAL_TABLET | Freq: Two times a day (BID) | ORAL | 0 refills | Status: AC

## 2023-07-31 NOTE — Discharge Instructions (Signed)
Please seek medical attention for any high fevers, chest pain, shortness of breath, change in behavior, persistent vomiting, bloody stool or any other new or concerning symptoms.  

## 2023-07-31 NOTE — ED Notes (Signed)
Patient given discharge instructions including prescriptions x2 and importance of staying hydrated and managing symptoms at home with stated understanding. Patient stable and ambulatory with steady even gait on dispo.

## 2023-07-31 NOTE — ED Provider Notes (Signed)
[facility] Provider Note    Event Date/Time   First MD Initiated Contact with Patient 07/31/23 0015     (approximate)   History   Cough   HPI  Jill Lopez is a 72 y.o. female who presents to the emergency department today because of concerns for cough and some fevers.  The patient states that the cough started about 24 hours ago.  She was unable to sleep because of the cough.  She then noticed some fevers.  Patient denies any known sick contacts.     Physical Exam   Triage Vital Signs: ED Triage Vitals  Encounter Vitals Group     BP 07/30/23 1939 (!) 135/113     Systolic BP Percentile --      Diastolic BP Percentile --      Pulse Rate 07/30/23 1939 (!) 118     Resp 07/30/23 1939 19     Temp 07/30/23 1939 (!) 100.6 F (38.1 C)     Temp src --      SpO2 07/30/23 1939 96 %     Weight 07/30/23 1942 200 lb (90.7 kg)     Height 07/30/23 1942 5\' 7"  (1.702 m)     Head Circumference --      Peak Flow --      Pain Score 07/30/23 1941 6     Pain Loc --      Pain Education --      Exclude from Growth Chart --     Most recent vital signs: Vitals:   07/30/23 1939  BP: (!) 135/113  Pulse: (!) 118  Resp: 19  Temp: (!) 100.6 F (38.1 C)  SpO2: 96%   General: Awake, alert, oriented. CV:  Good peripheral perfusion. Regular rate and rhythm. Resp:  Normal effort. Lungs clear. Occasional cough. Abd:  No distention.    ED Results / Procedures / Treatments   Labs (all labs ordered are listed, but only abnormal results are displayed) Labs Reviewed  SARS CORONAVIRUS 2 BY RT PCR - Abnormal; Notable for the following components:      Result Value   SARS Coronavirus 2 by RT PCR POSITIVE (*)    All other components within normal limits  COMPREHENSIVE METABOLIC PANEL - Abnormal; Notable for the following components:   Glucose, Bld 224 (*)    Calcium 8.7 (*)    All other components within normal limits  CBC WITH DIFFERENTIAL/PLATELET      EKG  None   RADIOLOGY I independently interpreted and visualized the CXR. My interpretation: No pneumonia. Radiology interpretation:  IMPRESSION:  No active disease.     PROCEDURES:  Critical Care performed: No    MEDICATIONS ORDERED IN ED: Medications  benzonatate (TESSALON) capsule 100 mg (has no administration in time range)  acetaminophen (TYLENOL) tablet 1,000 mg (1,000 mg Oral Given 07/30/23 1947)     IMPRESSION / MDM / ASSESSMENT AND PLAN / ED COURSE  I reviewed the triage vital signs and the nursing notes.                              Differential diagnosis includes, but is not limited to, covid, pneumonia, COPD  Patient's presentation is most consistent with acute presentation with potential threat to life or bodily function.  Patient presented to the emergency department today because of concerns for coughing and fever.  Patient did test positive for COVID.  X-ray without pneumonia.  Will plan on discharging with prescription for Paxlovid and cough medication.     FINAL CLINICAL IMPRESSION(S) / ED DIAGNOSES   Final diagnoses:  Acute cough  COVID-19    Note:  This document was prepared using Dragon voice recognition software and may include unintentional dictation errors.    Phineas Semen, MD 07/31/23 [phone]

## 2023-08-20 ENCOUNTER — Other Ambulatory Visit: Payer: Self-pay | Admitting: Internal Medicine

## 2023-08-20 DIAGNOSIS — Z1231 Encounter for screening mammogram for malignant neoplasm of breast: Secondary | ICD-10-CM

## 2023-08-31 ENCOUNTER — Ambulatory Visit
Admission: RE | Admit: 2023-08-31 | Discharge: 2023-08-31 | Disposition: A | Discharge status: Home / Self Care | Payer: Medicare Other | Source: Ambulatory Visit | Attending: Internal Medicine | Admitting: Internal Medicine

## 2023-08-31 DIAGNOSIS — Z1231 Encounter for screening mammogram for malignant neoplasm of breast: Secondary | ICD-10-CM | POA: Diagnosis present

## 2023-11-13 ENCOUNTER — Encounter: Payer: Self-pay | Admitting: Emergency Medicine

## 2023-11-13 ENCOUNTER — Other Ambulatory Visit: Payer: Self-pay

## 2023-11-13 ENCOUNTER — Emergency Department

## 2023-11-13 ENCOUNTER — Emergency Department
Admission: EM | Admit: 2023-11-13 | Discharge: 2023-11-13 | Disposition: A | Discharge status: Home / Self Care | Attending: Emergency Medicine | Admitting: Emergency Medicine

## 2023-11-13 DIAGNOSIS — M549 Dorsalgia, unspecified: Secondary | ICD-10-CM | POA: Diagnosis present

## 2023-11-13 DIAGNOSIS — E119 Type 2 diabetes mellitus without complications: Secondary | ICD-10-CM | POA: Insufficient documentation

## 2023-11-13 DIAGNOSIS — Z87442 Personal history of urinary calculi: Secondary | ICD-10-CM | POA: Diagnosis not present

## 2023-11-13 DIAGNOSIS — N12 Tubulo-interstitial nephritis, not specified as acute or chronic: Secondary | ICD-10-CM | POA: Diagnosis not present

## 2023-11-13 DIAGNOSIS — R109 Unspecified abdominal pain: Secondary | ICD-10-CM

## 2023-11-13 LAB — URINALYSIS, ROUTINE W REFLEX MICROSCOPIC
Bilirubin Urine: NEGATIVE
Glucose, UA: 150 mg/dL — AB
Ketones, ur: NEGATIVE mg/dL
Nitrite: NEGATIVE
Protein, ur: NEGATIVE mg/dL
Specific Gravity, Urine: 1.025 (ref 1.005–1.030)
pH: 5 (ref 5.0–8.0)

## 2023-11-13 LAB — COMPREHENSIVE METABOLIC PANEL
ALT: 16 U/L (ref 0–44)
AST: 19 U/L (ref 15–41)
Albumin: 3.4 g/dL — ABNORMAL LOW (ref 3.5–5.0)
Alkaline Phosphatase: 70 U/L (ref 38–126)
Anion gap: 9 (ref 5–15)
BUN: 16 mg/dL (ref 8–23)
CO2: 24 mmol/L (ref 22–32)
Calcium: 9 mg/dL (ref 8.9–10.3)
Chloride: 105 mmol/L (ref 98–111)
Creatinine, Ser: 0.85 mg/dL (ref 0.44–1.00)
GFR, Estimated: >60 mL/min (ref >60)
Glucose, Bld: 261 mg/dL — ABNORMAL HIGH (ref 70–99)
Potassium: 4.4 mmol/L (ref 3.5–5.1)
Sodium: 138 mmol/L (ref 135–145)
Total Bilirubin: 0.7 mg/dL (ref 0.0–1.2)
Total Protein: 7 g/dL (ref 6.5–8.1)

## 2023-11-13 LAB — CBC WITH DIFFERENTIAL/PLATELET
Abs Immature Granulocytes: 0.04 10*3/uL (ref 0.00–0.07)
Basophils Absolute: 0 10*3/uL (ref 0.0–0.1)
Basophils Relative: 1 %
Eosinophils Absolute: 0.1 10*3/uL (ref 0.0–0.5)
Eosinophils Relative: 1 %
HCT: 43.9 % (ref 36.0–46.0)
Hemoglobin: 14.1 g/dL (ref 12.0–15.0)
Immature Granulocytes: 1 %
Lymphocytes Relative: 13 %
Lymphs Abs: 1.1 10*3/uL (ref 0.7–4.0)
MCH: 29.9 pg (ref 26.0–34.0)
MCHC: 32.1 g/dL (ref 30.0–36.0)
MCV: 93 fL (ref 80.0–100.0)
Monocytes Absolute: 0.5 10*3/uL (ref 0.1–1.0)
Monocytes Relative: 5 %
Neutro Abs: 6.6 10*3/uL (ref 1.7–7.7)
Neutrophils Relative %: 79 %
Platelets: 159 10*3/uL (ref 150–400)
RBC: 4.72 MIL/uL (ref 3.87–5.11)
RDW: 13.2 % (ref 11.5–15.5)
Smear Review: NORMAL
WBC: 8.3 10*3/uL (ref 4.0–10.5)
nRBC: 0 % (ref 0.0–0.2)

## 2023-11-13 MED ORDER — KETOROLAC TROMETHAMINE 30 MG/ML IJ SOLN
15.0000 mg | Freq: Once | INTRAMUSCULAR | Status: AC
  Administered 2023-11-13: 15 mg via INTRAMUSCULAR
  Filled 2023-11-13: qty 1

## 2023-11-13 MED ORDER — ONDANSETRON 4 MG PO TBDP
4.0000 mg | ORAL_TABLET | Freq: Once | ORAL | Status: AC
  Administered 2023-11-13: 4 mg via ORAL
  Filled 2023-11-13: qty 1

## 2023-11-13 MED ORDER — ACETAMINOPHEN 500 MG PO TABS
1000.0000 mg | ORAL_TABLET | Freq: Once | ORAL | Status: AC
  Administered 2023-11-13: 1000 mg via ORAL
  Filled 2023-11-13: qty 2

## 2023-11-13 MED ORDER — SODIUM CHLORIDE 0.9 % IV SOLN
1.0000 g | Freq: Once | INTRAVENOUS | Status: AC
  Administered 2023-11-13: 1 g via INTRAVENOUS
  Filled 2023-11-13: qty 10

## 2023-11-13 MED ORDER — CEFDINIR 300 MG PO CAPS
300.0000 mg | ORAL_CAPSULE | Freq: Two times a day (BID) | ORAL | 0 refills | Status: AC
Start: 2023-11-13 — End: 2023-11-23

## 2023-11-13 NOTE — ED Triage Notes (Signed)
 Pt c/o acute onset R flank pain since 0430 with associated N/V. Pt reports extensive hx of kidney stones with surgical interventions.

## 2023-11-13 NOTE — Discharge Instructions (Signed)
 Please take the antibiotics as prescribed for your pyelonephritis.  Please return to the emergency department if you have persistent or worsening symptoms, persistent nausea vomiting, worsening pain, lightheadedness, if you pass out, if you have fevers, or if you have any additional concerns.  You can take 500 mg of Tylenol  or 400 mg of ibuprofen every 6 hours as needed for the pain.

## 2023-11-13 NOTE — ED Provider Notes (Signed)
 [name] Provider Note    Event Date/Time   First MD Initiated Contact with Patient 11/13/23 1040     (approximate)   History   Flank Pain   HPI Jill Lopez is a 73 y.o. female diabetes, guarding, history of kidney stones, presenting with right mid back pain that she states is consistent with prior history of stones.  She denies any dysuria hematuria, had some nausea vomiting earlier.  No vaginal discharge or bleeding, no fevers.  No chest pain or abdominal pain or shortness of breath.  States that the last procedure that was done for her kidney stones was more than 8 years ago.   On independent chart review she was evaluated by cardiology in July 2020 for for low heart rates, had a Zio patch placed that showed paroxysmal SVTs.  Patient has no complaints of palpitations or chest pain at this time.  Physical Exam   Triage Vital Signs: ED Triage Vitals [11/13/23 0747]  Encounter Vitals Group     BP (!) 143/86     Systolic BP Percentile      Diastolic BP Percentile      Pulse Rate 64     Resp 18     Temp (!) 97.5 F (36.4 C)     Temp Source Oral     SpO2 94 %     Weight      Height      Head Circumference      Peak Flow      Pain Score 10     Pain Loc      Pain Education      Exclude from Growth Chart     Most recent vital signs: Vitals:   11/13/23 0747  BP: (!) 143/86  Pulse: 64  Resp: 18  Temp: (!) 97.5 F (36.4 C)  SpO2: 94%     General: Awake, no distress.  CV:  Good peripheral perfusion.  Resp:  Normal effort.  Abd:  No distention.  Soft nontender Other:  Right CVA tenderness, no tenderness to palpation to her midline, no swelling or erythema or ecchymoses to her back and flank.   ED Results / Procedures / Treatments   Labs (all labs ordered are listed, but only abnormal results are displayed) Labs Reviewed  COMPREHENSIVE METABOLIC PANEL - Abnormal; Notable for the following components:      Result Value    Glucose, Bld 261 (*)    Albumin 3.4 (*)    All other components within normal limits  URINALYSIS, ROUTINE W REFLEX MICROSCOPIC - Abnormal; Notable for the following components:   Color, Urine YELLOW (*)    APPearance HAZY (*)    Glucose, UA 150 (*)    Hgb urine dipstick MODERATE (*)    Leukocytes,Ua SMALL (*)    Bacteria, UA RARE (*)    All other components within normal limits  CBC WITH DIFFERENTIAL/PLATELET     RADIOLOGY My interpretation of CT renal stone study, no obvious hydronephrosis bilaterally.  CT renal stone study showed multiple punctate calcifications in the kidney but otherwise no acute abdominal pelvic findings.   PROCEDURES:  Critical Care performed: No  Procedures   MEDICATIONS ORDERED IN ED: Medications  ondansetron  (ZOFRAN -ODT) disintegrating tablet 4 mg (4 mg Oral Given 11/13/23 0751)  acetaminophen  (TYLENOL ) tablet 1,000 mg (1,000 mg Oral Given 11/13/23 1136)  ketorolac  (TORADOL ) 30 MG/ML injection 15 mg (15 mg Intramuscular Given 11/13/23 1136)  cefTRIAXone  (ROCEPHIN ) 1 g in sodium  chloride 0.9 % 100 mL IVPB (1 g Intravenous New Bag/Given 11/13/23 1137)     IMPRESSION / MDM / ASSESSMENT AND PLAN / ED COURSE  I reviewed the triage vital signs and the nursing notes.                              Differential diagnosis includes, but is not limited to, nephrolithiasis, pyelonephritis, septic stone, musculoskeletal pain, UTI, renal failure.  No anterior abdominal pain to suggest appendicitis or gallbladder issues.  Also consider viral illness given nausea vomiting.  Patient's presentation is most consistent with acute complicated illness / injury requiring diagnostic workup.  Patient prior history of kidney stones presenting with right back pain and CVA tenderness.  Nausea vomiting improved after Zofran .  UA with rare bacteria, some WBCs, small leukocytes, given overall picture, suspicious of pyelonephritis.  CT imaging did not reveal any ureterolithiasis.   Patient is currently stable, well-appearing.  Independent review of chart revealed no prior urine micro.  Will give her a dose of ceftriaxone  here.  Also give her  Will reassess.  Shared decision making done with patient and she is agreeable plan for discharge, states that she can follow-up with a primary care doctor in 2 to 3 days for reassessment.  Strict precautions given.  Considered but no indication for inpatient admission at this time, she is a for outpatient management.  Clinical Course as of 11/13/23 1249  Tue Nov 13, 2023  1131 CT Renal Stone Study IMPRESSION: 1. No acute abdominopelvic findings. 2. Multiple tiny punctate calcifications within both kidneys which may reflect tiny nonobstructing stones and/or vascular calcifications. Duplicated renal collecting systems bilaterally with mild caliectasis of the right upper pole moiety. No hydronephrosis. 3. Small-moderate hiatal hernia. 4. 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 5. Aortic atherosclerosis (ICD10-I70.0).   [TT]    Clinical Course User Index [TT] Waymond Lorelle Cummins, MD     FINAL CLINICAL IMPRESSION(S) / ED DIAGNOSES   Final diagnoses:  Flank pain  Pyelonephritis     Rx / DC Orders   ED Discharge Orders          Ordered    cefdinir  (OMNICEF ) 300 MG capsule  2 times daily        11/13/23 1204             Note:  This document was prepared using Dragon voice recognition software and may include unintentional dictation errors.    Waymond Lorelle Cummins, MD 11/13/23 1249

## 2023-11-13 NOTE — ED Provider Triage Note (Signed)
 Emergency Medicine Provider Triage Evaluation Note  Jill Lopez , a 73 y.o. female  was evaluated in triage.  Pt complains of right flank pain with past history of stones.  Woke during the night with pain and N/V.    Review of Systems  Positive: Right flank pain, N/V Negative: No fever, chills  Physical Exam  BP (!) 143/86   Pulse 64   Temp (!) 97.5 F (36.4 C) (Oral)   Resp 18   SpO2 94%  Gen:   Awake, no distress   Resp:  Normal effort  Lungs Clear bilat.  MSK:   Moves extremities without difficulty  Other:    Medical Decision Making  Medically screening exam initiated at 7:51 AM.  Appropriate orders placed.  Aneesah Hernan was informed that the remainder of the evaluation will be completed by another provider, this initial triage assessment does not replace that evaluation, and the importance of remaining in the ED until their evaluation is complete.     Saunders Shona CROME, PA-C 11/13/23 [phone]

## 2024-01-28 ENCOUNTER — Emergency Department

## 2024-01-28 ENCOUNTER — Emergency Department
Admission: EM | Admit: 2024-01-28 | Discharge: 2024-01-28 | Disposition: A | Discharge status: Home / Self Care | Attending: Emergency Medicine | Admitting: Emergency Medicine

## 2024-01-28 ENCOUNTER — Other Ambulatory Visit: Payer: Self-pay

## 2024-01-28 DIAGNOSIS — R9082 White matter disease, unspecified: Secondary | ICD-10-CM | POA: Diagnosis not present

## 2024-01-28 DIAGNOSIS — W07XXXA Fall from chair, initial encounter: Secondary | ICD-10-CM | POA: Insufficient documentation

## 2024-01-28 DIAGNOSIS — E039 Hypothyroidism, unspecified: Secondary | ICD-10-CM | POA: Insufficient documentation

## 2024-01-28 DIAGNOSIS — S0990XA Unspecified injury of head, initial encounter: Secondary | ICD-10-CM | POA: Diagnosis not present

## 2024-01-28 DIAGNOSIS — S3210XA Unspecified fracture of sacrum, initial encounter for closed fracture: Secondary | ICD-10-CM | POA: Diagnosis not present

## 2024-01-28 DIAGNOSIS — E119 Type 2 diabetes mellitus without complications: Secondary | ICD-10-CM | POA: Insufficient documentation

## 2024-01-28 DIAGNOSIS — M533 Sacrococcygeal disorders, not elsewhere classified: Secondary | ICD-10-CM | POA: Diagnosis present

## 2024-01-28 MED ORDER — OXYCODONE-ACETAMINOPHEN 5-325 MG PO TABS: 0.5000 | ORAL_TABLET | ORAL | 0 refills | Status: AC | PRN

## 2024-01-28 MED ORDER — OXYCODONE-ACETAMINOPHEN 5-325 MG PO TABS
1.0000 | ORAL_TABLET | Freq: Once | ORAL | Status: AC
Start: 2024-01-28 — End: 2024-01-28
  Administered 2024-01-28: 1 via ORAL
  Filled 2024-01-28: qty 1

## 2024-01-28 NOTE — Discharge Instructions (Addendum)
 Unfortunately our emergency department does not supply donut pillows/cushions and therefore you will need to purchase 1 from that pharmacy.  Please use ibuprofen (Motrin) up to 800 mg every 8 hours, naproxen (Naprosyn) up to 500 mg every 12 hours, and/or acetaminophen (Tylenol) up to 4 g/day for any continued pain.  Please do not use this medication regimen for longer than 7 days

## 2024-01-28 NOTE — ED Triage Notes (Signed)
 Pt to ED via POV from home. Pt reports fell at a restaurant getting out of a chair landing on her bottom and hit her head on the chair. Pt reports posterior head back and tailbone pain. No LOC. No blood thinner.

## 2024-01-28 NOTE — ED Provider Notes (Signed)
 [facility] Provider Note   Event Date/Time   First MD Initiated Contact with Patient 01/28/24 1552     (approximate) History  Fall  HPI Jill Lopez is a 73 y.o. female with a stated past medical history of type 2 diabetes, hypothyroidism, hyperlipidemia, and GERD who presents after a mechanical fall out of the chair at a restaurant landing on her bottom on a carpeted floor as well as striking the back of her head on the seat of the chair.  Patient denies any loss of consciousness.  Patient now complains of 10/10 pain to the tailbone and has been unable to sit down comfortably since this occurred. ROS: Patient currently denies any vision changes, tinnitus, difficulty speaking, facial droop, sore throat, chest pain, shortness of breath, abdominal pain, nausea/vomiting/diarrhea, dysuria, or weakness/numbness/paresthesias in any extremity   Physical Exam  Triage Vital Signs: ED Triage Vitals  Encounter Vitals Group     BP 01/28/24 1449 131/74     Systolic BP Percentile --      Diastolic BP Percentile --      Pulse Rate 01/28/24 1449 68     Resp 01/28/24 1449 18     Temp 01/28/24 1449 98.5 F (36.9 C)     Temp Source 01/28/24 1449 Oral     SpO2 01/28/24 1449 97 %     Weight --      Height --      Head Circumference --      Peak Flow --      Pain Score 01/28/24 1455 10     Pain Loc --      Pain Education --      Exclude from Growth Chart --    Most recent vital signs: Vitals:   01/28/24 1449  BP: 131/74  Pulse: 68  Resp: 18  Temp: 98.5 F (36.9 C)  SpO2: 97%   General: Awake, oriented x4. CV:  Good peripheral perfusion.  Resp:  Normal effort.  Abd:  No distention.  Other:  Elderly obese Caucasian female resting comfortably in no acute distress ED Results / Procedures / Treatments  Labs (all labs ordered are listed, but only abnormal results are displayed) Labs Reviewed - No data to display RADIOLOGY ED MD interpretation: X-ray of  the sacrum/coccyx independently interpreted and shows cortical irregularity of the distal sacrococcygeal spine likely reflecting a nondisplaced fracture CT of the head without contrast interpreted by me shows no evidence of acute abnormalities including no intracerebral hemorrhage, obvious masses, or significant edema CT of the cervical spine interpreted by me does not show any evidence of acute abnormalities including no acute fracture, malalignment, height loss, or dislocation -Agree with radiology assessment Official radiology report(s): DG Sacrum/Coccyx Result Date: 01/28/2024 CLINICAL DATA:  Status post fall onto buttocks EXAM: SACRUM AND COCCYX - 3 VIEW COMPARISON:  None Available. FINDINGS: Diffuse demineralization. Cortical irregularity of the distal sacrococcygeal spine may reflect a nondisplaced fracture. IMPRESSION: Cortical irregularity of the distal sacrococcygeal spine may reflect a nondisplaced fracture. Electronically Signed   By: Agustin Cree M.D.   On: 01/28/2024 16:11   CT Head Wo Contrast Result Date: 01/28/2024 CLINICAL DATA:  Fall from chair, hit back of head EXAM: CT HEAD WITHOUT CONTRAST CT CERVICAL SPINE WITHOUT CONTRAST TECHNIQUE: Multidetector CT imaging of the head and cervical spine was performed following the standard protocol without intravenous contrast. Multiplanar CT image reconstructions of the cervical spine were also generated. RADIATION DOSE REDUCTION: This exam was performed according to  the departmental dose-optimization program which includes automated exposure control, adjustment of the mA and/or kV according to patient size and/or use of iterative reconstruction technique. COMPARISON:  None Available. FINDINGS: CT HEAD FINDINGS Brain: No evidence of acute infarction, hemorrhage, hydrocephalus, extra-axial collection or mass lesion/mass effect. Periventricular white matter hypodensity. Vascular: No hyperdense vessel or unexpected calcification. Skull: Normal. Negative  for fracture or focal lesion. Sinuses/Orbits: No acute finding. Other: None. CT CERVICAL SPINE FINDINGS Alignment: Degenerative straightening of the normal cervical lordosis. Skull base and vertebrae: No acute fracture. No primary bone lesion or focal pathologic process. Soft tissues and spinal canal: No prevertebral fluid or swelling. No visible canal hematoma. Disc levels: Moderate multilevel disc degenerative and osteophytosis. Upper chest: Negative. Other: None. IMPRESSION: 1. No acute intracranial pathology. Small-vessel white matter disease. 2. No fracture or static subluxation of the cervical spine. 3. Moderate multilevel cervical disc degenerative disease. Electronically Signed   By: Jearld Lesch M.D.   On: 01/28/2024 15:58   CT Cervical Spine Wo Contrast Result Date: 01/28/2024 CLINICAL DATA:  Fall from chair, hit back of head EXAM: CT HEAD WITHOUT CONTRAST CT CERVICAL SPINE WITHOUT CONTRAST TECHNIQUE: Multidetector CT imaging of the head and cervical spine was performed following the standard protocol without intravenous contrast. Multiplanar CT image reconstructions of the cervical spine were also generated. RADIATION DOSE REDUCTION: This exam was performed according to the departmental dose-optimization program which includes automated exposure control, adjustment of the mA and/or kV according to patient size and/or use of iterative reconstruction technique. COMPARISON:  None Available. FINDINGS: CT HEAD FINDINGS Brain: No evidence of acute infarction, hemorrhage, hydrocephalus, extra-axial collection or mass lesion/mass effect. Periventricular white matter hypodensity. Vascular: No hyperdense vessel or unexpected calcification. Skull: Normal. Negative for fracture or focal lesion. Sinuses/Orbits: No acute finding. Other: None. CT CERVICAL SPINE FINDINGS Alignment: Degenerative straightening of the normal cervical lordosis. Skull base and vertebrae: No acute fracture. No primary bone lesion or focal  pathologic process. Soft tissues and spinal canal: No prevertebral fluid or swelling. No visible canal hematoma. Disc levels: Moderate multilevel disc degenerative and osteophytosis. Upper chest: Negative. Other: None. IMPRESSION: 1. No acute intracranial pathology. Small-vessel white matter disease. 2. No fracture or static subluxation of the cervical spine. 3. Moderate multilevel cervical disc degenerative disease. Electronically Signed   By: Jearld Lesch M.D.   On: 01/28/2024 15:58   PROCEDURES: Critical Care performed: No Procedures MEDICATIONS ORDERED IN ED: Medications  oxyCODONE-acetaminophen (PERCOCET/ROXICET) 5-325 MG per tablet 1 tablet (1 tablet Oral Given 01/28/24 1628)   IMPRESSION / MDM / ASSESSMENT AND PLAN / ED COURSE  I reviewed the triage vital signs and the nursing notes.                             The patient is on the cardiac monitor to evaluate for evidence of arrhythmia and/or significant heart rate changes. Patient's presentation is most consistent with acute presentation with potential threat to life or bodily function. The Pt was found to have a closed sacrococcygeal fracture on XR. The Pt is otherwise well appearing, hemodynamically stable, and shows no evidence of neurovascular injury.  I have low suspicion for displacement, significant ligamentous injury, new autoimmune arthropathy, or gonococcal arthropathy. Patient was instructed to use a doughnut pillow/cushion and follow-up with orthopedics  Rx: norco  Dispo: Discharge home with orthopedic follow-up   FINAL CLINICAL IMPRESSION(S) / ED DIAGNOSES   Final diagnoses:  Closed fracture of sacrum,  unspecified portion of sacrum, initial encounter (HCC)   Rx / DC Orders   ED Discharge Orders          Ordered    oxyCODONE-acetaminophen (PERCOCET) 5-325 MG tablet  Every 4 hours PRN        01/28/24 1621           Note:  This document was prepared using Dragon voice recognition software and may include  unintentional dictation errors.   Merwyn Katos, MD 01/28/24 [phone]

## 2024-04-29 ENCOUNTER — Ambulatory Visit: Attending: Cardiology

## 2024-04-29 DIAGNOSIS — I340 Nonrheumatic mitral (valve) insufficiency: Secondary | ICD-10-CM | POA: Insufficient documentation

## 2024-04-29 LAB — ECHOCARDIOGRAM COMPLETE
AR max vel: 4.12 cm2
AV Area VTI: 4.05 cm2
AV Area mean vel: 3.84 cm2
AV Mean grad: 3 mmHg
AV Peak grad: 5.6 mmHg
Ao pk vel: 1.18 m/s
Area-P 1/2: 2.76 cm2
Calc EF: 56.4 %
S' Lateral: 3.4 cm
Single Plane A2C EF: 60.6 %
Single Plane A4C EF: 52.8 %

## 2024-05-05 ENCOUNTER — Ambulatory Visit: Payer: Self-pay | Admitting: Cardiology

## 2024-05-26 NOTE — Progress Notes (Signed)
 Chief Complaint:   Chief Complaint  Patient presents with  . Follow-up    Subjective:   Jill Lopez is a 73 y.o. female in today for follow up  History of Present Illness Jill Lopez is a 73 year old female with diabetes and knee osteoarthritis who presents for follow-up after a gel injection in her left knee.  She received a gel injection in her left knee ten days ago. The knee is doing well, because she has not had success with steroid injections in the past.  She had prior knee surgery from trauma and always has swelling in the left leg compared to the right leg.    She has a history of diabetes, which she monitors regularly. Her last A1c was 7.6.  She has been started on Ozempic and is currently on 0.5 mg subcu weekly.  No side effects.  She is taking all her medications okay.    Some signs of depression were discussed at last visit especially because of loneliness as she does not have a companion.  Grandsons live with her and help her.  She is staying busy taking care of animals as she has goats, chickens, cats and her dog.     She has plans for a trip to [facility] and maintains social connections through phone calls with friends and family.  She used to ride bikes, also has good news which she does when the heat is not too much  She has a history of hypertension, which has limited her ability to hike due to swelling in her hands during physical exertion. She continues to take B12 injections for energy and cognition, which she finds beneficial.       Current Outpatient Medications  Medication Sig Dispense Refill  . alendronate (FOSAMAX) 70 MG tablet Take 1 tablet (70 mg total) by mouth every 7 (seven) days Take with a full glass of water. Do not lie down for the next 30 min. 12 tablet 2  . ergocalciferol, vitamin D2, 1,250 mcg (50,000 unit) capsule Take 1 capsule (50,000 Units total) by mouth once a week 12 capsule 2  . ezetimibe (ZETIA) 10 mg tablet TAKE  ONE TABLET BY MOUTH EVERY DAY 90 tablet 1  . FUROsemide (LASIX) 20 MG tablet Take 1 tablet (20 mg total) by mouth every morning 90 tablet 1  . gabapentin (NEURONTIN) 100 MG capsule Take 1 capsule (100 mg total) by mouth 2 (two) times daily 1 po qHS 90 capsule 3  . levothyroxine (SYNTHROID) 50 MCG tablet Take one tablet Monday through Saturday and 2 tablets on Sunday on an empty stomach with a glass of water at least 30-60 minutes before breakfast.    . lisinopriL (ZESTRIL) 2.5 MG tablet Take 1 tablet (2.5 mg total) by mouth once daily 90 tablet 3  . metFORMIN (GLUCOPHAGE) 500 MG tablet Take 1 tablet (500 mg total) by mouth 2 (two) times daily with meals 180 tablet 3  . naproxen (NAPROSYN) 500 MG tablet Take 1 tablet (500 mg total) by mouth 2 (two) times daily with meals for 60 days 60 tablet 1  . ondansetron  (ZOFRAN -ODT) 8 MG disintegrating tablet Take 1 tablet (8 mg total) by mouth every 6 (six) hours as needed for Nausea 30 tablet 1  . semaglutide (OZEMPIC) 0.25 mg or 0.5 mg (2 mg/3 mL) pen injector INJECT 0.5 MG SUBCUTANEOUSLY ONCE A WEEK -(ADMINISTER INTO ABDOMEN, THIGH, OR UPPER ARM ON THE SAME DAY EACH WEEK). DISCARD PEN 56 DAYS AFTER FIRST  USE. 2 mL 0  . traMADoL (ULTRAM) 50 mg tablet Take 1 tablet (50 mg total) by mouth 2 (two) times daily as needed 1 po bid prn 14 tablet 0  . rosuvastatin (CRESTOR) 20 MG tablet Take 1 tablet (20 mg total) by mouth once daily 90 tablet 3   Current Facility-Administered Medications  Medication Dose Route Frequency Provider Last Rate Last Admin  . cyanocobalamin (VITAMIN B12) injection 1,000 mcg  1,000 mcg Intramuscular Q30 Days Fields, Glenda Lynn, NP   1,000 mcg at 05/19/24 9170    Allergies as of 05/26/2024 - Reviewed 05/26/2024  Allergen Reaction Noted  . Darvocet a500 [propoxyphene n-acetaminophen ] Itching and Other (See Comments) 02/10/2020    Past Medical History:  Diagnosis Date  . Depression   . Diabetes mellitus without complication  (CMS/HHS-HCC)   . GERD (gastroesophageal reflux disease)   . Hyperlipidemia   . Kidney stones   . Migraines    Following menopause, no more migraines    Past Surgical History:  Procedure Laterality Date  . COLONOSCOPY  06/11/2020   Tubular adenoma/PHx CP/Repeat 45yrs/TKT  . EXPLORATION KIDNEY    . INJECTION THERAPEUTIC AGENT CARPAL TUNNEL    . JOINT REPLACEMENT    . KNEE ARTHROSCOPY    . LAPAROSCOPIC GASTRIC BYPASS       Family History  Problem Relation Name Age of Onset  . Breast cancer Mother    . Alzheimer's disease Mother    . Dementia Mother    . Myocardial Infarction (Heart attack) Father    . Stroke Father    . Brain hemorrhage Father    . No Known Problems Brother    . No Known Problems Brother    . No Known Problems Brother    . Brain cancer Brother    . No Known Problems Brother      Social History:  reports that she quit smoking about 35 years ago. Her smoking use included cigarettes. She has never used smokeless tobacco. She reports that she does not currently use alcohol. She reports that she does not use drugs.  Results for orders placed or performed in visit on 04/01/24  X-ray pelvis 1 to 2 views   Narrative   EXAM: X-ray pelvis 1 to 2 views  INDICATION:    Bilateral hip pain [M25.551, M25.552 (ICD-10-CM)] COMPARISON:  TECHNIQUE AND FINDINGS:  1 views pelvis  Mild joint space narrowing within the right and left hips.  Mild areas of  peripheral hypertrophic spurring within the acetabular.  There is no  evidence of fracture, dislocation or malalignment.  No erosive changes.   Soft tissues are unremarkable.      Impression     Changes no acute osseous abnormalities. HECTOR WAYNE COOPER, MD        ROS:  General: No fever, chills or recent illness. No change in weight HEENT: No change in vision or hearing. No pain or difficulty with swallowing Respiratory: No cough or shortness of breath CV:  No chest pain or palpitations GI:  No pain,  dyspepsia or change in bowel habits GU:  No dysuria, frequency, or hesitancy MSK:  , Left knee pain is improving Neurological: No headaches, changes in mental status, loss of sensation or strength  Endocrine:  No heat or cold intolerance, polydipsia, polyuria Psychological: No anxiety insomnia, occasional feelings of loneliness  Objective:   Body mass index is 35.94 kg/m.  BP 110/60   Pulse 59   Ht 165.1 cm (5' 5)  Wt 98 kg (216 lb)   LMP  (LMP Unknown)   SpO2 94%   BMI 35.94 kg/m   General: WD/WN female, in no acute distress HEENT: Pupils equal and round, EOMI. oral mucosa moist.  Oropharynx clear. Neck: supple, trachea midline; no thyromegaly Respiratory:clear to auscultation.  No dullness to percussion.  No use of accessory muscles. Cardiac:  Regular rate and rhythm without murmur, gallops, or rubs Abdominal:soft, nontender, positive bowel sounds.  No organomegaly. Musculoskeletal:  No clubbing, cyanosis or edema.  Neuro: CN grossly intact.  No acute decrease in sensation in the upper and lower extremities bilaterally. Lymph: no cervical or supraclavicular lymphadenopathy   Assessment/Plan:   Type 2 diabetes mellitus without complication, without long-term current use of insulin (CMS/HHS-HCC)  (primary encounter diagnosis) B12 deficiency [E53.8] Acquired hypothyroidism Pure hypercholesterolemia Arthritis of left knee  Assessment & Plan    Type 2 diabetes mellitus -Last A1c of 7.6  - Discussed diabetes education, management. - Currently on Ozempic 0.5 mg subcu weekly - Labs ordered today.  Will go up to the 1 mg dose after the blood work - On metformin - Continue low-carb diet.  2. Left knee arthritis- - Chronic left knee swelling. - Continue to follow-up with orthopedics - Takes gabapentin at bedtime very low-dose 100 mg - Increase to twice daily for better - Also has naproxen.  Tramadol occasionally.  3. Depression - Experiences intermittent  depression, exacerbated by social isolation and the loss of a long-term partner.  - Engages in some social activities with family and has plans for future trips, which may help improve mood. - Encourage participation in local social gatherings or community activities to increase social interaction. - Hold off on medications at this time.  Monitor closely   4. B12 deficiency - Receiving B12 injections, which have been beneficial for energy and cognition. There was a period of non-compliance, but has resumed the injections. - Ensure regular B12 injections are scheduled. - Consider over-the-counter B12 supplements as a backup if injections are missed. - Definitely has improved her memory and cognition  5.  Hypertension- - On low-dose lisinopril for renal protection  6.  Hypothyroidism- - Continue Synthroid    Follow-up with me in 4 months for annual physical.  Sooner for acute issues        Goals     . * Lose Weight (pt-stated)      Get down to 150-180.        LAVENIA BEAVER, MD  Portions of this note were created using dictation software and may contain typographical errors.  *Some images could not be shown.

## 2024-06-14 NOTE — Progress Notes (Unsigned)
 Cardiology Clinic Note   Date: 06/18/2024 ID: Myrtie, Leuthold [birth year], MRN [id number]  Primary Cardiologist:  Redell Cave, MD  Chief Complaint   Jill Lopez is a 73 y.o. female who presents to the clinic today for routine follow up.   Patient Profile   Jill Lopez is followed by Dr. Cave for the history outlined below.      Past medical history significant for: DOE. Echo 04/29/2024: EF 55 to 60%.  No RWMA.  Grade I DD.  Normal RV size/function.  Normal PA pressure, RVSP 21.6 mmHg.  Moderate LAE.  Mild MR.  Borderline dilatation of aortic root 39 mm. PSVT/PVCs/bradycardia. 2 ZIO monitors for total of 17 days 03/29/2023: Monitor 1: HR 49 to 97 bpm, average 72 bpm.  Predominantly sinus rhythm.  26 runs of SVT fastest 5 beats max rate 197 bpm, longest 20 beats average rate 141 bpm.  Occasional PVCs 3.4%.  Monitored 2: HR 44 to 210 bpm, average 69 bpm.  Predominantly sinus rhythm.  2 runs of NSVT fastest 4 beats max rate 210 bpm, longest 4 beats average 109 bpm.  Episodes of VT may be SVT with possible aberrancy.  98 runs of SVT fastest 5 beats max rate 193 bpm, longest 22 beats average 100 bpm.  Occasional PVCs 5%. Hyperlipidemia. Lipid panel 08/17/2023: LDL 93, HDL 55, TG 95, total 167. OSA. T2DM.  In summary, patient was first evaluated by Dr. Cave on 05/04/2023 for bradycardia.  Patient had been seen by PCP approximately 2 months prior and noted to have a heart rate in the low 30s.  She was advised to go to the ED.  EKG showed sinus bradycardia with heart rate 54 bpm.  Cardiac monitor was placed.  Results of monitor demonstrated occasional PSVT, occasional PVCs.  She had recently been diagnosed with OSA and was awaiting CPAP.  Her BP was soft at 96/60.  It was felt that untreated sleep apnea could be contributing to her arrhythmias.  She was working on weight loss.  Echo showed normal LV/RV function with normal PA pressure, Grade I DD,  moderate LAE, mild to moderate MR, mild dilatation of aortic root 42 mm.     History of Present Illness    Today, patient reports increased lower extremity edema. She first noted left lower extremity edema months ago that she attributed to knee pain. She has a history of multiple left knee surgeries with recent steroid and gel injections to the knee. 2 weeks ago she noticed right lower extremity edema. She also reports 8 lb weight gain in the last 3-4 weeks. She elevates her  legs at night with initial resolution of edema but now it is not improving. She has baseline dyspnea that is unchanged from previous. She denies chest pain but reports 1 episodes of a weird sensation on the left side of her chest while shopping that lasted about 15-20 minutes. She stopped walking and waited for sensation to subside before returning to shopping with no further incident. She owns chickens and goats and cares for them with the help of her family. Since the episode while shopping she has been able to do her usual activities including lifting and carrying 50 lb bags of chicken feed. She denies palpitations. She states she never underwent sleep study. She does report only sleeping 4-5 hours a night and feeling sleepy throughout the day.     ROS: All other systems reviewed and are otherwise negative except as noted in History of Present Illness.  EKGs/Labs Reviewed    EKG Interpretation Date/Time:  Wednesday June 18 2024 14:26:51 EDT Ventricular Rate:  84 PR Interval:  130 QRS Duration:  72 QT Interval:  364 QTC Calculation: 430 R Axis:   -27  Text Interpretation: Normal sinus rhythm Nonspecific T wave abnormality When compared with ECG of 04-May-2023 13:58, Nonspecific T wave abnormality, improved in Lateral leads Confirmed by Loistine Sober [phone]) on 06/18/2024 2:31:26 PM   11/13/2023: ALT 16; AST 19; BUN 16; Creatinine, Ser 0.85; Potassium 4.4; Sodium 138   11/13/2023: Hemoglobin 14.1; WBC 8.3    Risk Assessment/Calculations          STOP-Bang Score:  5       Physical Exam    VS:  BP 120/60 (BP Location: Left Arm, Patient Position: Sitting, Cuff Size: Normal)   Pulse 84   Ht 5' 6 (1.676 m)   Wt 222 lb 2 oz (100.8 kg)   SpO2 96%   BMI 35.85 kg/m  , BMI Body mass index is 35.85 kg/m.  GEN: Well nourished, well developed, in no acute distress. Neck: No JVD or carotid bruits. Cardiac:  RRR. No murmur. No rubs or gallops.   Respiratory:  Respirations regular and unlabored. Clear to auscultation without rales, wheezing or rhonchi. GI: Soft, nontender, nondistended. Extremities: Radials/DP/PT 2+ and equal bilaterally. No clubbing or cyanosis. 1+ pitting edema bilateral lower extremities.   Skin: Warm and dry, no rash. Neuro: Strength intact.  Assessment & Plan   DOE/lower extremity edema/atypical chest pain Echo July 2025 demonstrated normal LV/RV function, Grade I DD, normal PA pressure, moderate LAE, mild MR, borderline dilatation of aortic root 39 mm.  Patient reports chronic dyspnea at baseline. She notes left lower extremity edema for several months and right lower extremity edema for the last 2 weeks. She endorses an 8 lb weight gain over 3-4 weeks. Regular weight reported as 210 lb. She is not weighing daily. She has been elevating legs with initial improvement in edema by morning but now it is unchanged. She reports 1 episode of a weird left sided chest sensation not really felt to be pain. It lasted 15-20 minutes before spontaneously resolving. It has not returned. She has been active carrying 50 lb bags of chicken feed with no pain. Discussed stress testing. Patient would like to defer for now. 1+ pitting edema bilateral lower extremities.  - Increase Lasix to 40 mg daily x 5 days then 20 mg daily thereafter. Weight daily. Can take extra 20 mg of Lasix for weight gain of 3 lb overnight and 5 lb in a week.  - Repeat blood work at follow up.    PSVT/PVCs/bradycardia ZIO monitoring demonstrated occasional runs of SVT, occasional PVCs.  Patient denies palpitations. RRR on exam today.  - Continue to monitor.   Hyperlipidemia LDL 93 October 2024, not at goal. - Continue rosuvastatin and Zetia. - Continue to follow with PCP.  Sleep disordered breathing Patient reports she never had a sleep study. She states someone was supposed to contact her about it but never did. She  reports snoring and feeling sleepy throughout the day. Stop Bang score 5.  - Refer to pulmonology.   Disposition: Increase Lasix to 40 mg daily x 5 days then 20 mg daily thereafter. Can take extra 20 mg with weight gain of 3 lb overnight or 5 lb in a week. Weigh daily. Return in 3 weeks or sooner as needed. Will do BMP at follow up.  Signed, Barnie HERO. Matti Minney, DNP, NP-C

## 2024-06-18 ENCOUNTER — Encounter: Payer: Self-pay | Admitting: Student

## 2024-06-18 ENCOUNTER — Ambulatory Visit: Attending: Student | Admitting: Student

## 2024-06-18 VITALS — BP 120/60 | HR 84 | Ht 66.0 in | Wt 222.1 lb

## 2024-06-18 DIAGNOSIS — R0609 Other forms of dyspnea: Secondary | ICD-10-CM | POA: Insufficient documentation

## 2024-06-18 DIAGNOSIS — R0789 Other chest pain: Secondary | ICD-10-CM | POA: Insufficient documentation

## 2024-06-18 DIAGNOSIS — E7800 Pure hypercholesterolemia, unspecified: Secondary | ICD-10-CM | POA: Insufficient documentation

## 2024-06-18 DIAGNOSIS — I471 Supraventricular tachycardia, unspecified: Secondary | ICD-10-CM | POA: Insufficient documentation

## 2024-06-18 DIAGNOSIS — R001 Bradycardia, unspecified: Secondary | ICD-10-CM | POA: Diagnosis present

## 2024-06-18 DIAGNOSIS — G4730 Sleep apnea, unspecified: Secondary | ICD-10-CM | POA: Diagnosis present

## 2024-06-18 DIAGNOSIS — R600 Localized edema: Secondary | ICD-10-CM | POA: Diagnosis present

## 2024-06-18 DIAGNOSIS — I493 Ventricular premature depolarization: Secondary | ICD-10-CM | POA: Insufficient documentation

## 2024-06-18 NOTE — Patient Instructions (Addendum)
 Medication Instructions:  Your physician recommends the following medication changes.  INCREASE: Furosemide (Lasix) to 40 mg once daily for 5 days. Resume Furosemide 20 mg once daily after the first 5 days Take extra Furosemide 20 mg once daily as needed for weight gain of 3 lbs or more overnight and 5 lbs or more in a period of one week.   *If you need a refill on your cardiac medications before your next appointment, please call your pharmacy*  Lab Work: None ordered at this time  If you have labs (blood work) drawn today and your tests are completely normal, you will receive your results only by: MyChart Message (if you have MyChart) OR A paper copy in the mail If you have any lab test that is abnormal or we need to change your treatment, we will call you to review the results.  Testing/Procedures: None ordered at this time   Referral:  You have been referred to Pulmonology for Sleep-Disordered Breathing.   Follow-Up: At [facility], you and your health needs are our priority.  As part of our continuing mission to provide you with exceptional heart care, our providers are all part of one team.  This team includes your primary Cardiologist (physician) and Advanced Practice Providers or APPs (Physician Assistants and Nurse Practitioners) who all work together to provide you with the care you need, when you need it.  Your next appointment:   3 week(s)  Provider:   Redell Cave, MD or Barnie Hila, NP    We recommend signing up for the patient portal called MyChart.  Sign up information is provided on this After Visit Summary.  MyChart is used to connect with patients for Virtual Visits (Telemedicine).  Patients are able to view lab/test results, encounter notes, upcoming appointments, etc.  Non-urgent messages can be sent to your provider as well.   To learn more about what you can do with MyChart, go to [email or web address].     Other  Instructions:  Please use the provided weight log to weigh yourself everyday.  Bring this log with you to your next visit.  Weigh yourself with the same scale, at the same time everyday.  We recommend doing this first thing in the morning after you have used the bathroom and before you have anything to eat or drink.

## 2024-07-03 ENCOUNTER — Encounter: Payer: Self-pay | Admitting: Sleep Medicine

## 2024-07-03 ENCOUNTER — Ambulatory Visit (INDEPENDENT_AMBULATORY_CARE_PROVIDER_SITE_OTHER): Admitting: Sleep Medicine

## 2024-07-03 VITALS — BP 110/66 | HR 58 | Temp 97.8°F | Ht 66.0 in | Wt 224.2 lb

## 2024-07-03 DIAGNOSIS — G4733 Obstructive sleep apnea (adult) (pediatric): Secondary | ICD-10-CM | POA: Diagnosis not present

## 2024-07-03 DIAGNOSIS — F5104 Psychophysiologic insomnia: Secondary | ICD-10-CM

## 2024-07-03 DIAGNOSIS — G4700 Insomnia, unspecified: Secondary | ICD-10-CM | POA: Diagnosis not present

## 2024-07-03 DIAGNOSIS — E1169 Type 2 diabetes mellitus with other specified complication: Secondary | ICD-10-CM | POA: Diagnosis not present

## 2024-07-03 DIAGNOSIS — I10 Essential (primary) hypertension: Secondary | ICD-10-CM | POA: Diagnosis not present

## 2024-07-03 NOTE — Patient Instructions (Signed)
 Jill Lopez

## 2024-07-03 NOTE — Progress Notes (Signed)
 Name:Jill Lopez MRN: [id number] DOB: [birth year]   CHIEF COMPLAINT:  EXCESSIVE DAYTIME SLEEPINESS   HISTORY OF PRESENT ILLNESS: Jill Lopez is a 73 y.o. w/ a h/o hypothyroidism, hyperlipidemia, GERD, DMII and obesity who presents for c/o loud snoring, witnessed apnea and excessive daytime sleepiness which has been present for several years. Reports nocturnal awakenings due to unclear reasons and has difficulty falling back to sleep. Reports a 20 lb weight gain over the last few years. Admits to dry mouth and night sweats. Denies morning headaches, RLS symptoms, dream enactment, cataplexy, hypnagogic or hypnapompic hallucinations. Reports a family history of sleep apnea. Denies drowsy driving. Drinks 1 cup of coffee daily and tea throughout the day, denies alcohol, tobacco or illicit drug use. Takes an hour nap after lunch daily.   Bedtime 6-9 pm Sleep onset 1-2 hours Rise time 8-9 am   EPWORTH SLEEP SCORE 7    07/03/2024    8:00 AM  Results of the Epworth flowsheet  Sitting and reading 3  Watching TV 1  Sitting, inactive in a public place (e.g. a theatre or a meeting) 0  As a passenger in a car for an hour without a break 0  Lying down to rest in the afternoon when circumstances permit 3  Sitting and talking to someone 0  Sitting quietly after a lunch without alcohol 0  In a car, while stopped for a few minutes in traffic 0  Total score 7     PAST MEDICAL HISTORY :   has a past medical history of Diabetes mellitus without complication (HCC), GERD (gastroesophageal reflux disease), Hyperlipidemia, Hypothyroidism, and Kidney stones.  has a past surgical history that includes Knee arthroscopy; Laparoscopic gastric bypass; Renal exploration; and INJECTION THERAPEUTIC AGENT CARPAL TUNNEL . Prior to Admission medications   Medication Sig Start Date End Date Taking? Authorizing Provider  benzonatate  (TESSALON  PERLES) 100 MG capsule Take 1 capsule (100 mg total) by  mouth 3 (three) times daily as needed for cough. 07/31/23 07/30/24 Yes Goodman, Graydon, MD  cyanocobalamin (VITAMIN B12) 1000 MCG/ML injection Inject 1,000 mcg into the muscle every 30 (thirty) days. 09/01/22  Yes [provider]  Exenatide ER (BYDUREON BCISE) 2 MG/0.85ML AUIJ INJECT 2 MG EVERY WEEK BY SUBCUTANEOUS ROUTE AS DIRECTED   Yes [provider]  ezetimibe (ZETIA) 10 MG tablet Take 1 tablet by mouth daily. 03/18/24  Yes [provider]  furosemide (LASIX) 20 MG tablet Take 1 tablet by mouth daily. 40 mg daily for 5 days.  20 mg daily after the first 5 days. Take an extra 20 mg once daily for weight gain of 3 lb or more overnight or 5 lbs or more in one week.   Yes [provider]  gabapentin (NEURONTIN) 100 MG capsule Take 100 mg by mouth at bedtime. 04/13/23  Yes [provider]  levothyroxine (SYNTHROID) 50 MCG tablet Take 50 mcg by mouth daily before breakfast. 02/25/24  Yes [provider]  lisinopril (ZESTRIL) 2.5 MG tablet Take 2.5 mg by mouth daily. 11/27/22  Yes [provider]  metFORMIN (GLUCOPHAGE) 500 MG tablet Take 1 tablet by mouth 2 (two) times daily. 01/24/18  Yes [provider]  naproxen (NAPROSYN) 500 MG tablet TAKE 1 TABLET BY MOUTH 2 (TWO) TIMES DAILY WITH MEALS FOR 60 DAYS 06/23/24  Yes [provider]  ondansetron  (ZOFRAN -ODT) 8 MG disintegrating tablet Take 8 mg by mouth every 8 (eight) hours as needed. 11/27/22  Yes [provider]  oxyCODONE -acetaminophen  (PERCOCET) 5-325 MG tablet Take 0.5-1 tablets by mouth every 4 (four) hours as needed for severe pain (pain score 7-10). 01/28/24  Yes Bradler, Evan K, MD  pantoprazole (PROTONIX) 40 MG tablet Take 40 mg by mouth. 09/06/22 07/03/24 Yes [provider]  predniSONE  (DELTASONE ) 10 MG tablet Take 6 tabs day 1, 5 tabs day 2, 4 tabs day 3, 3 tabs day 4, 2 tabs day 5, 1 tab day 6 05/16/20  Yes Baity, Angeline ORN, NP  rosuvastatin (CRESTOR)  20 MG tablet Take 20 mg by mouth. 11/27/22 07/03/24 Yes [provider]  Semaglutide, 1 MG/DOSE, 4 MG/3ML SOPN Inject 1 mg into the skin. 05/27/24  Yes [provider]  Vitamin D, Ergocalciferol, (DRISDOL) 1.25 MG (50000 UNIT) CAPS capsule Take 50,000 Units by mouth once a week. 09/17/23  Yes [provider]   Allergies  Allergen Reactions   Other Itching and Other (See Comments)    Propoxyphene N-Acetaminophen  (Darvocet) caused Tingling    FAMILY HISTORY:  family history includes Atrial fibrillation in her daughter; Breast cancer in her mother; Heart disease in her daughter. SOCIAL HISTORY:  reports that she has never smoked. She has never used smokeless tobacco. She reports that she does not drink alcohol and does not use drugs.   Review of Systems:  Gen:  Denies  fever, sweats, chills weight loss  HEENT: Denies blurred vision, double vision, ear pain, eye pain, hearing loss, nose bleeds, sore throat Cardiac:  No dizziness, chest pain or heaviness, chest tightness,edema, No JVD Resp:   No cough, -sputum production, -shortness of breath,-wheezing, -hemoptysis,  Gi: Denies swallowing difficulty, stomach pain, nausea or vomiting, diarrhea, constipation, bowel incontinence Gu:  Denies bladder incontinence, burning urine Ext:   Denies Joint pain, stiffness or swelling Skin: Denies  skin rash, easy bruising or bleeding or hives Endoc:  Denies polyuria, polydipsia , polyphagia or weight change Psych:   Denies depression, insomnia or hallucinations  Other:  All other systems negative  VITAL SIGNS: BP 110/66   Pulse (!) 58   Temp 97.8 F (36.6 C)   Ht 5' 6 (1.676 m)   Wt 224 lb 3.2 oz (101.7 kg)   SpO2 97%   BMI 36.19 kg/m    Physical Examination:   General Appearance: No distress  EYES PERRLA, EOM intact.   NECK Supple, No JVD Pulmonary: normal breath sounds, No wheezing.  CardiovascularNormal S1,S2.  No m/r/g.   Abdomen: Benign, Soft,  non-tender. Skin:   warm, no rashes, no ecchymosis  Extremities: normal, no cyanosis, clubbing. Neuro:without focal findings,  speech normal  PSYCHIATRIC: Mood, affect within normal limits.   ASSESSMENT AND PLAN  OSA I suspect that OSA is likely present due to clinical presentation. Discussed the consequences of untreated sleep apnea. Advised not to drive drowsy for safety of patient and others. Will complete further evaluation with a home sleep study and follow up to review results.    HTN Stable, on current management. Following with PCP.   DMII Stable, on current management.  Insomnia Counseled patient on stimulus control and improving sleep hygiene practices.    MEDICATION ADJUSTMENTS/LABS AND TESTS ORDERED: Recommend Sleep Study   Patient  satisfied with Plan of action and management. All questions answered  Follow up to review HST results and treatment plan.   I spent a total of 48 minutes reviewing chart data, face-to-face evaluation with the patient, counseling and coordination of care as detailed above.    Loyalty Arentz, M.D.  Sleep Medicine [facility]

## 2024-07-09 NOTE — Progress Notes (Deleted)
 Cardiology Clinic Note   Date: 07/09/2024 ID: Jill Lopez, DOB [birth year], MRN [id number]  Primary Cardiologist:  Redell Cave, MD  Chief Complaint   Jill Lopez is a 73 y.o. female who presents to the clinic today for ***  Patient Profile   Jill Lopez is followed by *** for the history outlined below.       Past medical history significant for: DOE. Echo 04/29/2024: EF 55 to 60%.  No RWMA.  Grade I DD.  Normal RV size/function.  Normal PA pressure, RVSP 21.6 mmHg.  Moderate LAE.  Mild MR.  Borderline dilatation of aortic root 39 mm. PSVT/PVCs/bradycardia. 2 ZIO monitors for total of 17 days 03/29/2023: Monitor 1: HR 49 to 97 bpm, average 72 bpm.  Predominantly sinus rhythm.  26 runs of SVT fastest 5 beats max rate 197 bpm, longest 20 beats average rate 141 bpm.  Occasional PVCs 3.4%.  Monitored 2: HR 44 to 210 bpm, average 69 bpm.  Predominantly sinus rhythm.  2 runs of NSVT fastest 4 beats max rate 210 bpm, longest 4 beats average 109 bpm.  Episodes of VT may be SVT with possible aberrancy.  98 runs of SVT fastest 5 beats max rate 193 bpm, longest 22 beats average 100 bpm.  Occasional PVCs 5%. Hyperlipidemia. Lipid panel 08/17/2023: LDL 93, HDL 55, TG 95, total 167. OSA. T2DM.  In summary, patient was first evaluated by Dr. Cave on 05/04/2023 for bradycardia.  Patient had been seen by PCP approximately 2 months prior and noted to have a heart rate in the low 30s.  She was advised to go to the ED.  EKG showed sinus bradycardia with heart rate 54 bpm.  Cardiac monitor was placed.  Results of monitor demonstrated occasional PSVT, occasional PVCs.  She had recently been diagnosed with OSA and was awaiting CPAP.  Her BP was soft at 96/60.  It was felt that untreated sleep apnea could be contributing to her arrhythmias.  She was working on weight loss.  Echo showed normal LV/RV function with normal PA pressure, Grade I DD, moderate LAE, mild to moderate  MR, mild dilatation of aortic root 42 mm.   Patient was last seen in the office by me on 06/18/2024 for routine follow-up.  She reported increased lower extremity edema and 8 pound weight gain in 3 to 4 weeks.  She reported stable baseline dyspnea.  She described 1 episode of a weird sensation on the left side of her chest while shopping that lasted 15 to 20 minutes and resolved with rest.  She cares for chickens and goats at home and had been able to do her usual activities including carrying 50 pound bags of chicken feed without chest pain or dyspnea.  She had 1+ pitting edema bilateral lower extremities on exam.  She was instructed to weigh daily.  Lasix was increased to 40 mg x 5 days then resume 20 mg daily.  She was instructed to take an extra 20 mg of Lasix for weight gain.  She was referred to pulmonology for sleep study as she had not had that done previously.     History of Present Illness    Today, patient ***  DOE/lower extremity edema/atypical chest pain Echo July 2025 demonstrated normal LV/RV function, Grade I DD, normal PA pressure, moderate LAE, mild MR, borderline dilatation of aortic root 39 mm.  Patient reports chronic dyspnea at baseline. She*** Euvolemic and well compensated on exam.  - Continue Lasix. - BMP today***  PSVT/PVCs/bradycardia ZIO monitoring demonstrated occasional runs of SVT, occasional PVCs.  Patient denies palpitations. RRR on exam today.*** - Continue to monitor.    Hyperlipidemia LDL 93 October 2024, not at goal. - Continue rosuvastatin and Zetia. - Continue to follow with PCP.***   Sleep disordered breathing Stop Bang score 5.  Patient was evaluated by sleep medicine on 07/03/2024 and is pending home sleep study. - Continue to follow with pulmonology.   ROS: All other systems reviewed and are otherwise negative except as noted in History of Present Illness.  EKGs/Labs Reviewed        11/13/2023: ALT 16; AST 19; BUN 16; Creatinine, Ser 0.85;  Potassium 4.4; Sodium 138   11/13/2023: Hemoglobin 14.1; WBC 8.3   No results found for requested labs within last 365 days.   No results found for requested labs within last 365 days.  ***  Risk Assessment/Calculations    {Does this patient have ATRIAL FIBRILLATION?:[phone]} No BP recorded.  {Refresh Note OR Click here to enter BP  :1}***   STOP-Bang Score:  5  { Consider Dx Sleep Disordered Breathing or Sleep Apnea  ICD G47.33          :1}     Physical Exam    VS:  There were no vitals taken for this visit. , BMI There is no height or weight on file to calculate BMI.  GEN: Well nourished, well developed, in no acute distress. Neck: No JVD or carotid bruits. Cardiac: *** RRR. *** No murmur. No rubs or gallops.   Respiratory:  Respirations regular and unlabored. Clear to auscultation without rales, wheezing or rhonchi. GI: Soft, nontender, nondistended. Extremities: Radials/DP/PT 2+ and equal bilaterally. No clubbing or cyanosis. No edema ***  Skin: Warm and dry, no rash. Neuro: Strength intact.  Assessment & Plan   ***  Disposition: ***     {Are you ordering a CV Procedure (e.g. stress test, cath, DCCV, TEE, etc)?   Press F2        :[id number]}   Signed, Barnie HERO. Ijeoma Loor, DNP, NP-C

## 2024-07-11 ENCOUNTER — Ambulatory Visit: Admitting: Student

## 2024-07-11 ENCOUNTER — Encounter

## 2024-07-11 DIAGNOSIS — G4733 Obstructive sleep apnea (adult) (pediatric): Secondary | ICD-10-CM

## 2024-07-31 ENCOUNTER — Telehealth: Payer: Self-pay | Admitting: Sleep Medicine

## 2024-07-31 NOTE — Telephone Encounter (Signed)
 Need to reschedule her 08/04/24 result appointment. Try to schedule on 08/18/24 since already in [place] that day.

## 2024-08-04 ENCOUNTER — Encounter: Payer: Self-pay | Admitting: Sleep Medicine

## 2024-08-04 ENCOUNTER — Ambulatory Visit: Admitting: Sleep Medicine

## 2024-08-06 DIAGNOSIS — G4733 Obstructive sleep apnea (adult) (pediatric): Secondary | ICD-10-CM | POA: Diagnosis not present

## 2024-08-07 ENCOUNTER — Ambulatory Visit: Payer: Self-pay

## 2024-08-15 NOTE — Progress Notes (Unsigned)
 Cardiology Clinic Note   Date: 08/18/2024 ID: Gaynelle, Pastrana 06-Oct-1951, MRN [id number]  Primary Cardiologist:  Redell Cave, MD  Chief Complaint   Jill Lopez is a 73 y.o. female who presents to the clinic today for routine follow up.   Patient Profile   Jill Lopez is followed by Dr. Cave for the history outlined below.       Past medical history significant for: DOE. Echo 04/29/2024: EF 55 to 60%.  No RWMA.  Grade I DD.  Normal RV size/function.  Normal PA pressure, RVSP 21.6 mmHg.  Moderate LAE.  Mild MR.  Borderline dilatation of aortic root 39 mm. PSVT/PVCs/bradycardia. 2 ZIO monitors for total of 17 days 03/29/2023: Monitor 1: HR 49 to 97 bpm, average 72 bpm.  Predominantly sinus rhythm.  26 runs of SVT fastest 5 beats max rate 197 bpm, longest 20 beats average rate 141 bpm.  Occasional PVCs 3.4%.  Monitored 2: HR 44 to 210 bpm, average 69 bpm.  Predominantly sinus rhythm.  2 runs of NSVT fastest 4 beats max rate 210 bpm, longest 4 beats average 109 bpm.  Episodes of VT may be SVT with possible aberrancy.  98 runs of SVT fastest 5 beats max rate 193 bpm, longest 22 beats average 100 bpm.  Occasional PVCs 5%. Hyperlipidemia. Lipid panel 08/17/2023: LDL 93, HDL 55, TG 95, total 167. OSA. T2DM.  In summary, patient was first evaluated by Dr. Cave on 05/04/2023 for bradycardia.  Patient had been seen by PCP approximately 2 months prior and noted to have a heart rate in the low 30s.  She was advised to go to the ED.  EKG showed sinus bradycardia with heart rate 54 bpm.  Cardiac monitor was placed.  Results of monitor demonstrated occasional PSVT, occasional PVCs.  She had recently been diagnosed with OSA and was awaiting CPAP.  Her BP was soft at 96/60.  It was felt that untreated sleep apnea could be contributing to her arrhythmias.  She was working on weight loss.  Echo showed normal LV/RV function with normal PA pressure, Grade I DD,  moderate LAE, mild to moderate MR, mild dilatation of aortic root 42 mm.   Patient was last seen in the office by me on 06/18/2024 for routine follow-up.  She reported increased lower extremity edema and 8 pound weight gain in 3 to 4 weeks.  She reported stable baseline dyspnea.  She described 1 episode of a weird sensation on the left side of her chest while shopping that lasted 15 to 20 minutes and resolved with rest.  She cares for chickens and goats at home and had been able to do her usual activities including carrying 50 pound bags of chicken feed without chest pain or dyspnea.  She had 1+ pitting edema bilateral lower extremities on exam.  She was instructed to weigh daily.  Lasix was increased to 40 mg x 5 days then resume 20 mg daily.  She was instructed to take an extra 20 mg of Lasix for weight gain.  She was referred to pulmonology for sleep study as she had not had that done previously.      History of Present Illness    Today, patient is doing well. Patient denies orthopnea or PND. No further chest pain, pressure, or tightness. No palpitations or fluttering sensation in her chest.  She continues to have dyspnea with heavier exertion. She reports no issue when carrying 50 lb chicken feed bags or walking in the grocery store  but will experience dyspnea when walking up hill. She has chronic lower extremity edema she manages with Lasix and elevation. She has not tried compression socks yet, as she is waiting for her daughter to bring her some. Her weight is down 12 lb from September. She is waiting for her CPAP machine and has a follow up visit with pulmonology today.       ROS: All other systems reviewed and are otherwise negative except as noted in History of Present Illness.  EKGs/Labs Reviewed       EKG is not performed today.   11/13/2023: ALT 16; AST 19; BUN 16; Creatinine, Ser 0.85; Potassium 4.4; Sodium 138   11/13/2023: Hemoglobin 14.1; WBC 8.3    Risk Assessment/Calculations           STOP-Bang Score:  5       Physical Exam    VS:  BP 134/80   Pulse 68   Ht 5' 6 (1.676 m)   Wt 212 lb 3.2 oz (96.3 kg)   SpO2 97%   BMI 34.25 kg/m  , BMI Body mass index is 34.25 kg/m.  GEN: Well nourished, well developed, in no acute distress. Neck: No JVD or carotid bruits. Cardiac:  RRR.  No murmur. No rubs or gallops.   Respiratory:  Respirations regular and unlabored. Clear to auscultation without rales, wheezing or rhonchi. GI: Soft, nontender, nondistended. Extremities: Radials/DP/PT 2+ and equal bilaterally. No clubbing or cyanosis. Mild, nonpitting lower extremity edema.   Skin: Warm and dry, no rash. Neuro: Strength intact.  Assessment & Plan   DOE/lower extremity edema/atypical chest pain Echo July 2025 demonstrated normal LV/RV function, Grade I DD, normal PA pressure, moderate LAE, mild MR, borderline dilatation of aortic root 39 mm.  Patient reports chronic dyspnea at baseline. She notices it more when she is walking up hill. No dyspnea with carrying 50 lb chicken feed bags or walking in grocery store. She reports chronic lower extremity edema managed with Lasix and elevation. She is planning on trying compression socks when she gets them from her daughter. No orthopnea or PND. Mild, nonpitting lower extremity edema. Euvolemic and well compensated on exam.  - Continue Lasix. - Continue elevation and try compression socks.    PSVT/PVCs/bradycardia ZIO monitoring May 2024 demonstrated occasional runs of SVT, occasional PVCs.  Patient denies palpitations. RRR on exam today. - Continue to monitor.    Hyperlipidemia LDL 93 October 2024, not at goal. - Continue rosuvastatin and Zetia. - Continue to follow with PCP.   Sleep disordered breathing Stop Bang score 5.  Patient was evaluated by sleep medicine on 07/03/2024. She is going to start CPAP. She has a follow up with pulmonology today.  - Continue to follow with pulmonology.  Disposition: Return in 1  year or sooner as needed.          Signed, Barnie HERO. Honesti Seaberg, DNP, NP-C

## 2024-08-18 ENCOUNTER — Ambulatory Visit: Attending: Student | Admitting: Student

## 2024-08-18 ENCOUNTER — Encounter: Payer: Self-pay | Admitting: Sleep Medicine

## 2024-08-18 ENCOUNTER — Encounter: Payer: Self-pay | Admitting: Student

## 2024-08-18 ENCOUNTER — Ambulatory Visit: Admitting: Sleep Medicine

## 2024-08-18 VITALS — BP 100/60 | HR 86 | Temp 98.8°F | Ht 66.0 in | Wt 212.8 lb

## 2024-08-18 VITALS — BP 134/80 | HR 68 | Ht 66.0 in | Wt 212.2 lb

## 2024-08-18 DIAGNOSIS — I471 Supraventricular tachycardia, unspecified: Secondary | ICD-10-CM | POA: Insufficient documentation

## 2024-08-18 DIAGNOSIS — I10 Essential (primary) hypertension: Secondary | ICD-10-CM | POA: Diagnosis not present

## 2024-08-18 DIAGNOSIS — G4730 Sleep apnea, unspecified: Secondary | ICD-10-CM | POA: Insufficient documentation

## 2024-08-18 DIAGNOSIS — E785 Hyperlipidemia, unspecified: Secondary | ICD-10-CM | POA: Diagnosis present

## 2024-08-18 DIAGNOSIS — R0609 Other forms of dyspnea: Secondary | ICD-10-CM | POA: Insufficient documentation

## 2024-08-18 DIAGNOSIS — G4733 Obstructive sleep apnea (adult) (pediatric): Secondary | ICD-10-CM

## 2024-08-18 DIAGNOSIS — R600 Localized edema: Secondary | ICD-10-CM | POA: Diagnosis not present

## 2024-08-18 NOTE — Progress Notes (Signed)
 Name:Jill Lopez MRN: [id number] DOB: 26-Dec-1950   CHIEF COMPLAINT:  HST F/U   HISTORY OF PRESENT ILLNESS: Jill Lopez is a 73 y.o. w/ a h/o hypothyroidism, hyperlipidemia, GERD, DMII and obesity who presents to follow up on HST results. The patient underwent HST which revealed moderate OSA (AHI 27, O2 nadir 82%).    EPWORTH SLEEP SCORE 7    07/03/2024    8:00 AM  Results of the Epworth flowsheet  Sitting and reading 3  Watching TV 1  Sitting, inactive in a public place (e.g. a theatre or a meeting) 0  As a passenger in a car for an hour without a break 0  Lying down to rest in the afternoon when circumstances permit 3  Sitting and talking to someone 0  Sitting quietly after a lunch without alcohol 0  In a car, while stopped for a few minutes in traffic 0  Total score 7    PAST MEDICAL HISTORY :   has a past medical history of Diabetes mellitus without complication (HCC), GERD (gastroesophageal reflux disease), Hyperlipidemia, Hypothyroidism, and Kidney stones.  has a past surgical history that includes Knee arthroscopy; Laparoscopic gastric bypass; Renal exploration; and INJECTION THERAPEUTIC AGENT CARPAL TUNNEL . Prior to Admission medications   Medication Sig Start Date End Date Taking? Authorizing Provider  benzonatate  (TESSALON  PERLES) 100 MG capsule Take 1 capsule (100 mg total) by mouth 3 (three) times daily as needed for cough. 07/31/23 07/30/24 Yes Goodman, Graydon, MD  cyanocobalamin (VITAMIN B12) 1000 MCG/ML injection Inject 1,000 mcg into the muscle every 30 (thirty) days. 09/01/22  Yes [provider]  Exenatide ER (BYDUREON BCISE) 2 MG/0.85ML AUIJ INJECT 2 MG EVERY WEEK BY SUBCUTANEOUS ROUTE AS DIRECTED   Yes [provider]  ezetimibe (ZETIA) 10 MG tablet Take 1 tablet by mouth daily. 03/18/24  Yes [provider]  furosemide (LASIX) 20 MG tablet Take 1 tablet by mouth daily. 40 mg daily for 5 days.  20 mg daily after  the first 5 days. Take an extra 20 mg once daily for weight gain of 3 lb or more overnight or 5 lbs or more in one week.   Yes [provider]  gabapentin (NEURONTIN) 100 MG capsule Take 100 mg by mouth at bedtime. 04/13/23  Yes [provider]  levothyroxine (SYNTHROID) 50 MCG tablet Take 50 mcg by mouth daily before breakfast. 02/25/24  Yes [provider]  lisinopril (ZESTRIL) 2.5 MG tablet Take 2.5 mg by mouth daily. 11/27/22  Yes [provider]  metFORMIN (GLUCOPHAGE) 500 MG tablet Take 1 tablet by mouth 2 (two) times daily. 01/24/18  Yes [provider]  naproxen (NAPROSYN) 500 MG tablet TAKE 1 TABLET BY MOUTH 2 (TWO) TIMES DAILY WITH MEALS FOR 60 DAYS 06/23/24  Yes [provider]  ondansetron  (ZOFRAN -ODT) 8 MG disintegrating tablet Take 8 mg by mouth every 8 (eight) hours as needed. 11/27/22  Yes [provider]  oxyCODONE -acetaminophen  (PERCOCET) 5-325 MG tablet Take 0.5-1 tablets by mouth every 4 (four) hours as needed for severe pain (pain score 7-10). 01/28/24  Yes Bradler, Evan K, MD  pantoprazole (PROTONIX) 40 MG tablet Take 40 mg by mouth. 09/06/22 07/03/24 Yes [provider]  predniSONE  (DELTASONE ) 10 MG tablet Take 6 tabs day 1, 5 tabs day 2, 4 tabs day 3, 3 tabs day 4, 2 tabs day 5, 1 tab day 6 05/16/20  Yes Baity, Angeline ORN, NP  rosuvastatin (CRESTOR) 20 MG  tablet Take 20 mg by mouth. 11/27/22 07/03/24 Yes [provider]  Semaglutide, 1 MG/DOSE, 4 MG/3ML SOPN Inject 1 mg into the skin. 05/27/24  Yes [provider]  Vitamin D, Ergocalciferol, (DRISDOL) 1.25 MG (50000 UNIT) CAPS capsule Take 50,000 Units by mouth once a week. 09/17/23  Yes [provider]   Allergies  Allergen Reactions   Other Itching and Other (See Comments)    Propoxyphene N-Acetaminophen  (Darvocet) caused Tingling    FAMILY HISTORY:  family history includes Atrial fibrillation in her daughter; Breast cancer in her  mother; Heart disease in her daughter. SOCIAL HISTORY:  reports that she has never smoked. She has never used smokeless tobacco. She reports that she does not drink alcohol and does not use drugs.   Review of Systems:  Gen:  Denies  fever, sweats, chills weight loss  HEENT: Denies blurred vision, double vision, ear pain, eye pain, hearing loss, nose bleeds, sore throat Cardiac:  No dizziness, chest pain or heaviness, chest tightness,edema, No JVD Resp:   No cough, -sputum production, -shortness of breath,-wheezing, -hemoptysis,  Gi: Denies swallowing difficulty, stomach pain, nausea or vomiting, diarrhea, constipation, bowel incontinence Gu:  Denies bladder incontinence, burning urine Ext:   Denies Joint pain, stiffness or swelling Skin: Denies  skin rash, easy bruising or bleeding or hives Endoc:  Denies polyuria, polydipsia , polyphagia or weight change Psych:   Denies depression, insomnia or hallucinations  Other:  All other systems negative  VITAL SIGNS: BP 100/60   Pulse 86   Temp 98.8 F (37.1 C)   Ht 5' 6 (1.676 m)   Wt 212 lb 12.8 oz (96.5 kg)   SpO2 96%   BMI 34.35 kg/m    Physical Examination:   General Appearance: No distress  EYES PERRLA, EOM intact.   NECK Supple, No JVD Pulmonary: normal breath sounds, No wheezing.  CardiovascularNormal S1,S2.  No m/r/g.   Abdomen: Benign, Soft, non-tender. Skin:   warm, no rashes, no ecchymosis  Extremities: normal, no cyanosis, clubbing. Neuro:without focal findings,  speech normal  PSYCHIATRIC: Mood, affect within normal limits.   ASSESSMENT AND PLAN  OSA Reviewed HST results with patient. Starting on APAP therapy at 4-16 cm H2O. Discussed the consequences of untreated sleep apnea. Advised not to drive drowsy for safety of patient and others. Will follow up in 3 months.    HTN Stable, on current management. Following with PCP.    Patient  satisfied with Plan of action and management. All questions answered  I  spent a total of 30 minutes reviewing chart data, face-to-face evaluation with the patient, counseling and coordination of care as detailed above.    Aracelia Brinson, M.D.  Sleep Medicine [facility]

## 2024-08-18 NOTE — Patient Instructions (Addendum)
 [name]

## 2024-08-18 NOTE — Patient Instructions (Signed)
 Medication Instructions:   Your physician recommends that you continue on your current medications as directed. Please refer to the Current Medication list given to you today.    *If you need a refill on your cardiac medications before your next appointment, please call your pharmacy*  Lab Work:  None ordered at this time   If you have labs (blood work) drawn today and your tests are completely normal, you will receive your results only by:  MyChart Message (if you have MyChart) OR  A paper copy in the mail If you have any lab test that is abnormal or we need to change your treatment, we will call you to review the results.  Testing/Procedures:  None ordered at this time   Referrals:  None ordered at this time   Follow-Up:  At [facility], you and your health needs are our priority.  As part of our continuing mission to provide you with exceptional heart care, our providers are all part of one team.  This team includes your primary Cardiologist (physician) and Advanced Practice Providers or APPs (Physician Assistants and Nurse Practitioners) who all work together to provide you with the care you need, when you need it.  Your next appointment:   1 year(s)  Provider:    Redell Cave, MD or Barnie Hila, NP    We recommend signing up for the patient portal called MyChart.  Sign up information is provided on this After Visit Summary.  MyChart is used to connect with patients for Virtual Visits (Telemedicine).  Patients are able to view lab/test results, encounter notes, upcoming appointments, etc.  Non-urgent messages can be sent to your provider as well.   To learn more about what you can do with MyChart, go to [email or web address].

## 2024-10-08 ENCOUNTER — Telehealth: Payer: Self-pay | Admitting: Sleep Medicine

## 2024-10-08 NOTE — Telephone Encounter (Signed)
 CPAP compliance appointment needed prior to 11/27/24

## 2024-11-04 ENCOUNTER — Other Ambulatory Visit: Payer: Self-pay | Admitting: Internal Medicine

## 2024-11-04 DIAGNOSIS — Z1231 Encounter for screening mammogram for malignant neoplasm of breast: Secondary | ICD-10-CM

## 2024-11-04 NOTE — Telephone Encounter (Signed)
 CPAP compliance between 09/28/24 to 11/27/24.

## 2024-11-17 ENCOUNTER — Ambulatory Visit: Admitting: Sleep Medicine

## 2024-11-17 ENCOUNTER — Encounter: Payer: Self-pay | Admitting: Sleep Medicine

## 2024-11-17 VITALS — BP 90/60 | HR 92 | Temp 98.2°F | Ht 66.0 in | Wt 213.2 lb

## 2024-11-17 DIAGNOSIS — G4733 Obstructive sleep apnea (adult) (pediatric): Secondary | ICD-10-CM | POA: Diagnosis not present

## 2024-11-17 DIAGNOSIS — F5104 Psychophysiologic insomnia: Secondary | ICD-10-CM

## 2024-11-17 DIAGNOSIS — I10 Essential (primary) hypertension: Secondary | ICD-10-CM

## 2024-11-17 DIAGNOSIS — G4700 Insomnia, unspecified: Secondary | ICD-10-CM

## 2024-11-17 NOTE — Progress Notes (Signed)
 "      Name:Jill Lopez MRN: [id number] DOB: Jul 27, 1951   CHIEF COMPLAINT:  CPAP F/U   HISTORY OF PRESENT ILLNESS: Jill Lopez is a 74 y.o. w/ a h/o OSA, hypothyroidism, hyperlipidemia, GERD, DMII and obesity who presents for CPAP follow up visit. Reports using CPAP therapy almost every night, which is confirmed by compliance data. Reports some difficulty with sleep initiation and sleep maintenance. She is currently using the Airfit N30i nasal mask, which causes air leaks. Overall reports feeling more refreshed upon awakening with CPAP therapy.    EPWORTH SLEEP SCORE 7    07/03/2024    8:00 AM  Results of the Epworth flowsheet  Sitting and reading 3  Watching TV 1  Sitting, inactive in a public place (e.g. a theatre or a meeting) 0  As a passenger in a car for an hour without a break 0  Lying down to rest in the afternoon when circumstances permit 3  Sitting and talking to someone 0  Sitting quietly after a lunch without alcohol 0  In a car, while stopped for a few minutes in traffic 0  Total score 7    PAST MEDICAL HISTORY :   has a past medical history of Diabetes mellitus without complication (HCC), GERD (gastroesophageal reflux disease), Hyperlipidemia, Hypothyroidism, and Kidney stones.  has a past surgical history that includes Knee arthroscopy; Laparoscopic gastric bypass; Renal exploration; and INJECTION THERAPEUTIC AGENT CARPAL TUNNEL . Prior to Admission medications   Medication Sig Start Date End Date Taking? Authorizing Provider  benzonatate  (TESSALON  PERLES) 100 MG capsule Take 1 capsule (100 mg total) by mouth 3 (three) times daily as needed for cough. 07/31/23 07/30/24 Yes Goodman, Graydon, MD  cyanocobalamin (VITAMIN B12) 1000 MCG/ML injection Inject 1,000 mcg into the muscle every 30 (thirty) days. 09/01/22  Yes [provider]  Exenatide ER (BYDUREON BCISE) 2 MG/0.85ML AUIJ INJECT 2 MG EVERY WEEK BY SUBCUTANEOUS ROUTE AS DIRECTED   Yes  [provider]  ezetimibe (ZETIA) 10 MG tablet Take 1 tablet by mouth daily. 03/18/24  Yes [provider]  furosemide (LASIX) 20 MG tablet Take 1 tablet by mouth daily. 40 mg daily for 5 days.  20 mg daily after the first 5 days. Take an extra 20 mg once daily for weight gain of 3 lb or more overnight or 5 lbs or more in one week.   Yes [provider]  gabapentin (NEURONTIN) 100 MG capsule Take 100 mg by mouth at bedtime. 04/13/23  Yes [provider]  levothyroxine (SYNTHROID) 50 MCG tablet Take 50 mcg by mouth daily before breakfast. 02/25/24  Yes [provider]  lisinopril (ZESTRIL) 2.5 MG tablet Take 2.5 mg by mouth daily. 11/27/22  Yes [provider]  metFORMIN (GLUCOPHAGE) 500 MG tablet Take 1 tablet by mouth 2 (two) times daily. 01/24/18  Yes [provider]  naproxen (NAPROSYN) 500 MG tablet TAKE 1 TABLET BY MOUTH 2 (TWO) TIMES DAILY WITH MEALS FOR 60 DAYS 06/23/24  Yes [provider]  ondansetron  (ZOFRAN -ODT) 8 MG disintegrating tablet Take 8 mg by mouth every 8 (eight) hours as needed. 11/27/22  Yes [provider]  oxyCODONE -acetaminophen  (PERCOCET) 5-325 MG tablet Take 0.5-1 tablets by mouth every 4 (four) hours as needed for severe pain (pain score 7-10). 01/28/24  Yes Bradler, Evan K, MD  pantoprazole (PROTONIX) 40 MG tablet Take 40 mg by mouth. 09/06/22 07/03/24 Yes [provider]  predniSONE  (DELTASONE ) 10 MG tablet Take 6 tabs day  1, 5 tabs day 2, 4 tabs day 3, 3 tabs day 4, 2 tabs day 5, 1 tab day 6 05/16/20  Yes Baity, Angeline ORN, NP  rosuvastatin (CRESTOR) 20 MG tablet Take 20 mg by mouth. 11/27/22 07/03/24 Yes [provider]  Semaglutide, 1 MG/DOSE, 4 MG/3ML SOPN Inject 1 mg into the skin. 05/27/24  Yes [provider]  Vitamin D, Ergocalciferol, (DRISDOL) 1.25 MG (50000 UNIT) CAPS capsule Take 50,000 Units by mouth once a week. 09/17/23  Yes [provider]   Allergies   Allergen Reactions   Other Itching and Other (See Comments)    Propoxyphene N-Acetaminophen  (Darvocet) caused Tingling    FAMILY HISTORY:  family history includes Atrial fibrillation in her daughter; Breast cancer in her mother; Heart disease in her daughter. SOCIAL HISTORY:  reports that she has never smoked. She has never used smokeless tobacco. She reports that she does not drink alcohol and does not use drugs.   Review of Systems:  Gen:  Denies  fever, sweats, chills weight loss  HEENT: Denies blurred vision, double vision, ear pain, eye pain, hearing loss, nose bleeds, sore throat Cardiac:  No dizziness, chest pain or heaviness, chest tightness,edema, No JVD Resp:   No cough, -sputum production, -shortness of breath,-wheezing, -hemoptysis,  Gi: Denies swallowing difficulty, stomach pain, nausea or vomiting, diarrhea, constipation, bowel incontinence Gu:  Denies bladder incontinence, burning urine Ext:   Denies Joint pain, stiffness or swelling Skin: Denies  skin rash, easy bruising or bleeding or hives Endoc:  Denies polyuria, polydipsia , polyphagia or weight change Psych:   Denies depression, insomnia or hallucinations  Other:  All other systems negative  VITAL SIGNS: BP 90/60   Pulse 92   Temp 98.2 F (36.8 C)   Ht 5' 6 (1.676 m)   Wt 213 lb 3.2 oz (96.7 kg) Comment: with heavy boots on  SpO2 93% Comment: with acrylic/gel nails  BMI 34.41 kg/m    Physical Examination:   General Appearance: No distress  EYES PERRLA, EOM intact.   NECK Supple, No JVD Pulmonary: normal breath sounds, No wheezing.  CardiovascularNormal S1,S2.  No m/r/g.   Abdomen: Benign, Soft, non-tender. Skin:   warm, no rashes, no ecchymosis  Extremities: normal, no cyanosis, clubbing. Neuro:without focal findings,  speech normal  PSYCHIATRIC: Mood, affect within normal limits.   ASSESSMENT AND PLAN  OSA Patient is using and benefiting from CPAP therapy. Counseled patient on  increasing CPAP compliance. Discussed the consequences of untreated sleep apnea. Advised not to drive drowsy for safety of patient and others. Will follow up in 3 months.    HTN Stable, on current management. Following with PCP.   Insomnia Counseled patient on stimulus control and improving sleep hygiene practices.    Patient  satisfied with Plan of action and management. All questions answered  I spent a total of 25 minutes reviewing chart data, face-to-face evaluation with the patient, counseling and coordination of care as detailed above.    Gaven Eugene, M.D.  Sleep Medicine [facility]        "

## 2024-11-17 NOTE — Patient Instructions (Signed)

## 2024-11-20 NOTE — Addendum Note (Signed)
 Addended by: Sidra Oldfield on: 11/20/2024 12:58 PM   Modules accepted: Level of Service

## 2024-12-08 ENCOUNTER — Encounter

## 2025-02-16 ENCOUNTER — Ambulatory Visit: Admitting: Sleep Medicine
